# Patient Record
Sex: Male | Born: 1955 | Race: Black or African American | Hispanic: No | Marital: Single | Smoking: Never smoker
Health system: Southern US, Community
[De-identification: ages and names within clinical notes are randomized; demographics above are authoritative.]

## PROBLEM LIST (undated history)

## (undated) DIAGNOSIS — I10 Essential (primary) hypertension: Secondary | ICD-10-CM

---

## 2017-07-11 DIAGNOSIS — R7302 Impaired glucose tolerance (oral): Secondary | ICD-10-CM | POA: Insufficient documentation

## 2017-07-25 DIAGNOSIS — Z13220 Encounter for screening for lipoid disorders: Secondary | ICD-10-CM | POA: Diagnosis not present

## 2017-07-25 DIAGNOSIS — Z125 Encounter for screening for malignant neoplasm of prostate: Secondary | ICD-10-CM | POA: Diagnosis not present

## 2017-07-25 DIAGNOSIS — Z23 Encounter for immunization: Secondary | ICD-10-CM | POA: Diagnosis not present

## 2017-07-25 DIAGNOSIS — Z1159 Encounter for screening for other viral diseases: Secondary | ICD-10-CM | POA: Diagnosis not present

## 2017-07-25 DIAGNOSIS — Z131 Encounter for screening for diabetes mellitus: Secondary | ICD-10-CM | POA: Diagnosis not present

## 2017-07-25 DIAGNOSIS — Z0000 Encounter for general adult medical examination without abnormal findings: Secondary | ICD-10-CM | POA: Diagnosis not present

## 2017-08-13 DIAGNOSIS — Z1211 Encounter for screening for malignant neoplasm of colon: Secondary | ICD-10-CM | POA: Diagnosis not present

## 2017-09-22 DIAGNOSIS — Z1211 Encounter for screening for malignant neoplasm of colon: Secondary | ICD-10-CM | POA: Diagnosis not present

## 2017-09-22 DIAGNOSIS — K5730 Diverticulosis of large intestine without perforation or abscess without bleeding: Secondary | ICD-10-CM | POA: Diagnosis not present

## 2020-02-11 ENCOUNTER — Ambulatory Visit (INDEPENDENT_AMBULATORY_CARE_PROVIDER_SITE_OTHER): Payer: Commercial Managed Care - PPO

## 2020-02-11 ENCOUNTER — Ambulatory Visit
Admission: EM | Admit: 2020-02-11 | Discharge: 2020-02-11 | Disposition: A | Discharge status: Home / Self Care | Payer: Commercial Managed Care - PPO | Attending: Emergency Medicine | Admitting: Emergency Medicine

## 2020-02-11 DIAGNOSIS — R1011 Right upper quadrant pain: Secondary | ICD-10-CM | POA: Diagnosis not present

## 2020-02-11 DIAGNOSIS — R0781 Pleurodynia: Secondary | ICD-10-CM | POA: Diagnosis not present

## 2020-02-11 DIAGNOSIS — S29011A Strain of muscle and tendon of front wall of thorax, initial encounter: Secondary | ICD-10-CM

## 2020-02-11 MED ORDER — TIZANIDINE HCL 4 MG PO TABS: 4.0000 mg | ORAL_TABLET | Freq: Three times a day (TID) | ORAL | 0 refills | Status: DC | PRN

## 2020-02-11 NOTE — ED Provider Notes (Signed)
HPI  SUBJECTIVE:  Brian Ingram is a 64 y.o. male who presents with 2 days of sharp, seconds long nonmigratory, nonradiating anterior right lower rib pain.  He has 1-3 episodes per day.  The last 1 to 2 seconds and then resolved.  No fevers, nausea, vomiting, cough, wheeze, shortness of breath, pleuritic pain with inspiration.  No abdominal distention, no other abdominal pain.  No surgery in the past 4 weeks, recent immobilization, hemoptysis, calf pain or swelling.  No rash in the area of pain.  No trauma.  No change in his physical activity.  He denies other chest pain.  He is able to point with 1 finger to the area of pain.  He has never had symptoms like this before.  He has not tried anything for this.  It resolves on its own.  It is not associated with eating, torso rotation, movement, inspiration.  Past medical history negative for diabetes, DVT, PE, cancer, hypertension, gallbladder disease, liver disease, heavy alcohol use.  He had varicella as a child.  PMD: Dr. Bayard Males   History reviewed. No pertinent past medical history.  History reviewed. No pertinent surgical history.  Family History  Problem Relation Age of Onset  . Healthy Mother   . Cancer Father     Social History   Tobacco Use  . Smoking status: Never Smoker  . Smokeless tobacco: Never Used  Vaping Use  . Vaping Use: Never used  Substance Use Topics  . Alcohol use: Yes  . Drug use: Never    No current facility-administered medications for this encounter.  Current Outpatient Medications:  .  tiZANidine (ZANAFLEX) 4 MG tablet, Take 1 tablet (4 mg total) by mouth every 8 (eight) hours as needed for muscle spasms., Disp: 30 tablet, Rfl: 0  No Known Allergies   ROS  As noted in HPI.   Physical Exam  BP (!) 144/90 (BP Location: Left Arm)   Pulse 95   Temp 99.2 F (37.3 C)   Resp 18   SpO2 96%   Constitutional: Well developed, well nourished, no acute distress Eyes:  EOMI, conjunctiva normal  bilaterally HENT: Normocephalic, atraumatic,mucus membranes moist Respiratory: Normal inspiratory effort, lungs clear bilaterally, good air movement.  No tenderness, bruising, rash in the area of pain.  Patient points to ribs 7/8 at the costochondral junction as area of pain.  Pain is not aggravated with deep inspiration,, torso movement Cardiovascular: Normal rate, regular rhythm no murmurs rubs or gallops GI: Normal appearance, soft, nontender, nondistended.  No guarding, rebound.  Active bowel sounds.  No palpable liver.  Negative Murphy. skin: No rash, skin intact Musculoskeletal: Calves symmetric, nontender, no edema Neurologic: Alert & oriented x 3, no focal neuro deficits Psychiatric: Speech and behavior appropriate   ED Course   Medications - No data to display  Orders Placed This Encounter  Procedures  . DG Chest 2 View    Standing Status:   Standing    Number of Occurrences:   1    Order Specific Question:   Reason for Exam (SYMPTOM  OR DIAGNOSIS REQUIRED)    Answer:   R anteiror rib pain r/o effusion ptx    No results found for this or any previous visit (from the past 24 hour(s)). DG Chest 2 View  Result Date: 02/11/2020 CLINICAL DATA:  64 year old male with right anterior rib and right upper quadrant pain for 3-4 days. Query effusion or pneumothorax. EXAM: CHEST - 2 VIEW COMPARISON:  None. FINDINGS: Normal lung volumes  and mediastinal contours. Visualized tracheal air column is within normal limits. No pneumothorax or pleural effusion. Bilateral lung markings are within normal limits. No pulmonary edema or confluent pulmonary opacity. Unremarkable visible osseous structures. Negative visible bowel gas pattern. IMPRESSION: Negative.  No acute cardiopulmonary abnormality. Electronically Signed   By: Odessa Fleming M.D.   On: 02/11/2020 15:15    ED Clinical Impression  1. Intercostal muscle strain, initial encounter      ED Assessment/Plan  Patient points to ribs 7 and 8 at  the costochondral junction as areas of pain.  In the differential is costochondritis, intercostal muscle spasm.  However pain is not reproducible which would be more consistent with costochondritis.  Favor muscle spasm.  His abdomen is benign.  Do not think that cholecystitis, cholelithiasis, hepatitis, PE likely.  Will check chest x-ray to rule out pneumonia, pneumothorax, pleural effusion.  There is no evidence of trauma to the ribs thus rib series was not done.  Reviewed imaging independently. Normal CXR.  See radiology report for full details.  Chest x-ray normal.  Suspect intermittent intercostal muscle spasm.  Will send home with Zanaflex.  Follow-up with PMD as needed.  Strict ER return precautions given.    Discussed  imaging, MDM, treatment plan, and plan for follow-up with patient. Discussed sn/sx that should prompt return to the ED. patient agrees with plan.   Meds ordered this encounter  Medications  . tiZANidine (ZANAFLEX) 4 MG tablet    Sig: Take 1 tablet (4 mg total) by mouth every 8 (eight) hours as needed for muscle spasms.    Dispense:  30 tablet    Refill:  0    *This clinic note was created using Scientist, clinical (histocompatibility and immunogenetics). Therefore, there may be occasional mistakes despite careful proofreading.   ?    Domenick Gong, MD 02/11/20 [phone]

## 2020-02-11 NOTE — ED Triage Notes (Signed)
RUQ pain x 3-4 days.  Lasts about 4-5 seconds and occurs 2-3 times per day.  No accompanying symptoms.  Denies n/v. No urinary concerns.

## 2020-02-11 NOTE — Discharge Instructions (Addendum)
Your chest x-ray was negative for fluid, infection, or collapsed lung.  I suspect this is a muscle strain with resulting spasm.  You can take the Zanaflex if the pain becomes prolonged.  Go immediately to the ER if you get worse, changes, shortness of breath, coughing up blood, fevers above 100.4, or for other concerns.

## 2021-01-05 IMAGING — CR DG CHEST 2V
3 series · 3 of 3 positions shown · non-contrast
Comparison: None.

CLINICAL DATA: 63-year-old male with right anterior rib and right
upper quadrant pain for 3-4 days. Query effusion or pneumothorax.

EXAM:
CHEST - 2 VIEW

[chest pa]
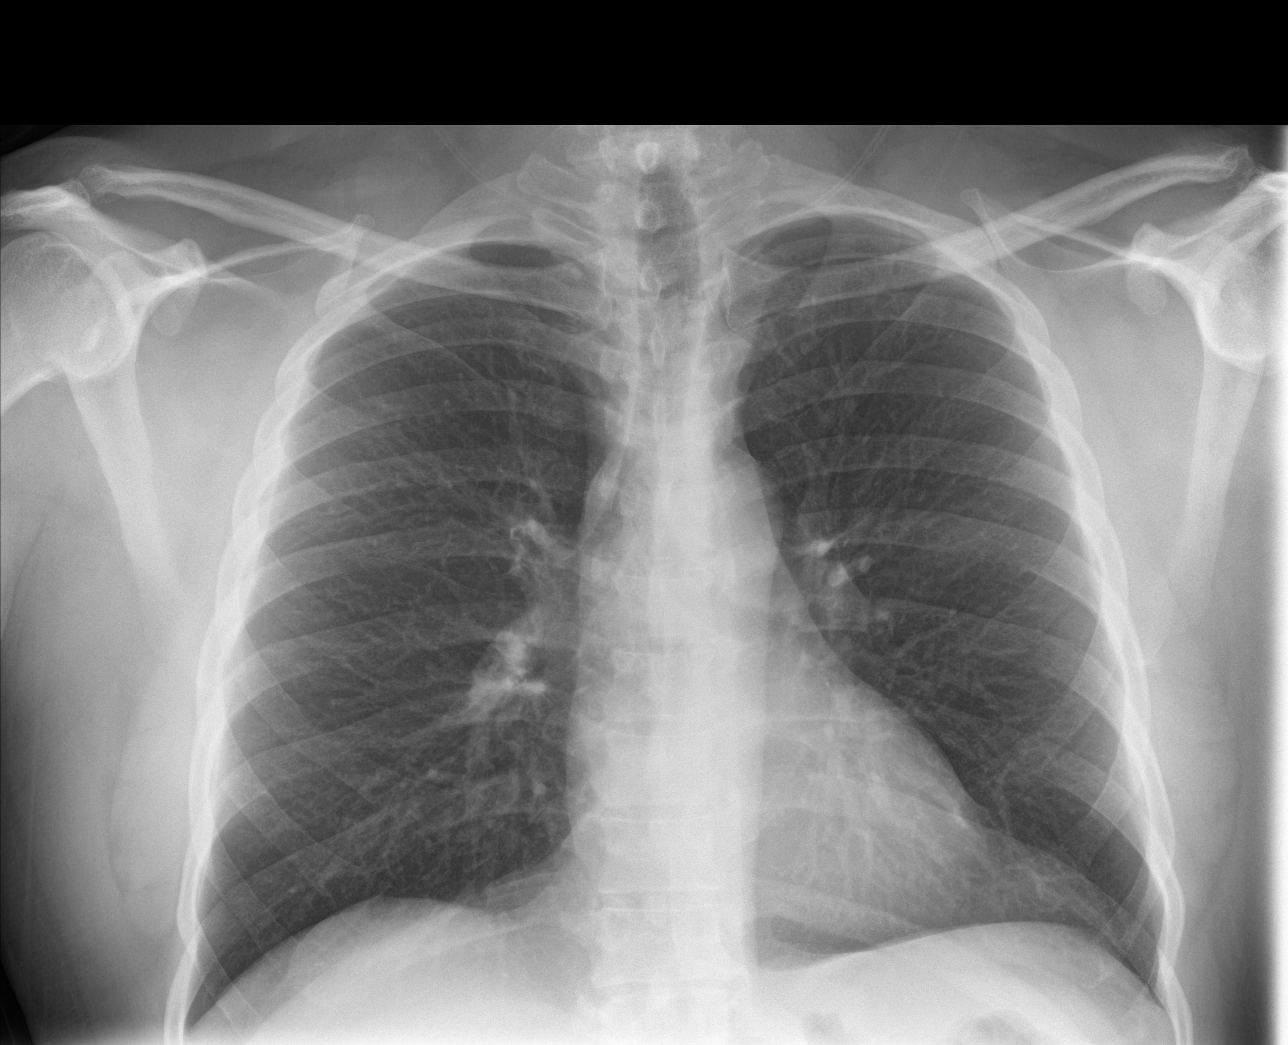

[chest lat (1 of 2)]
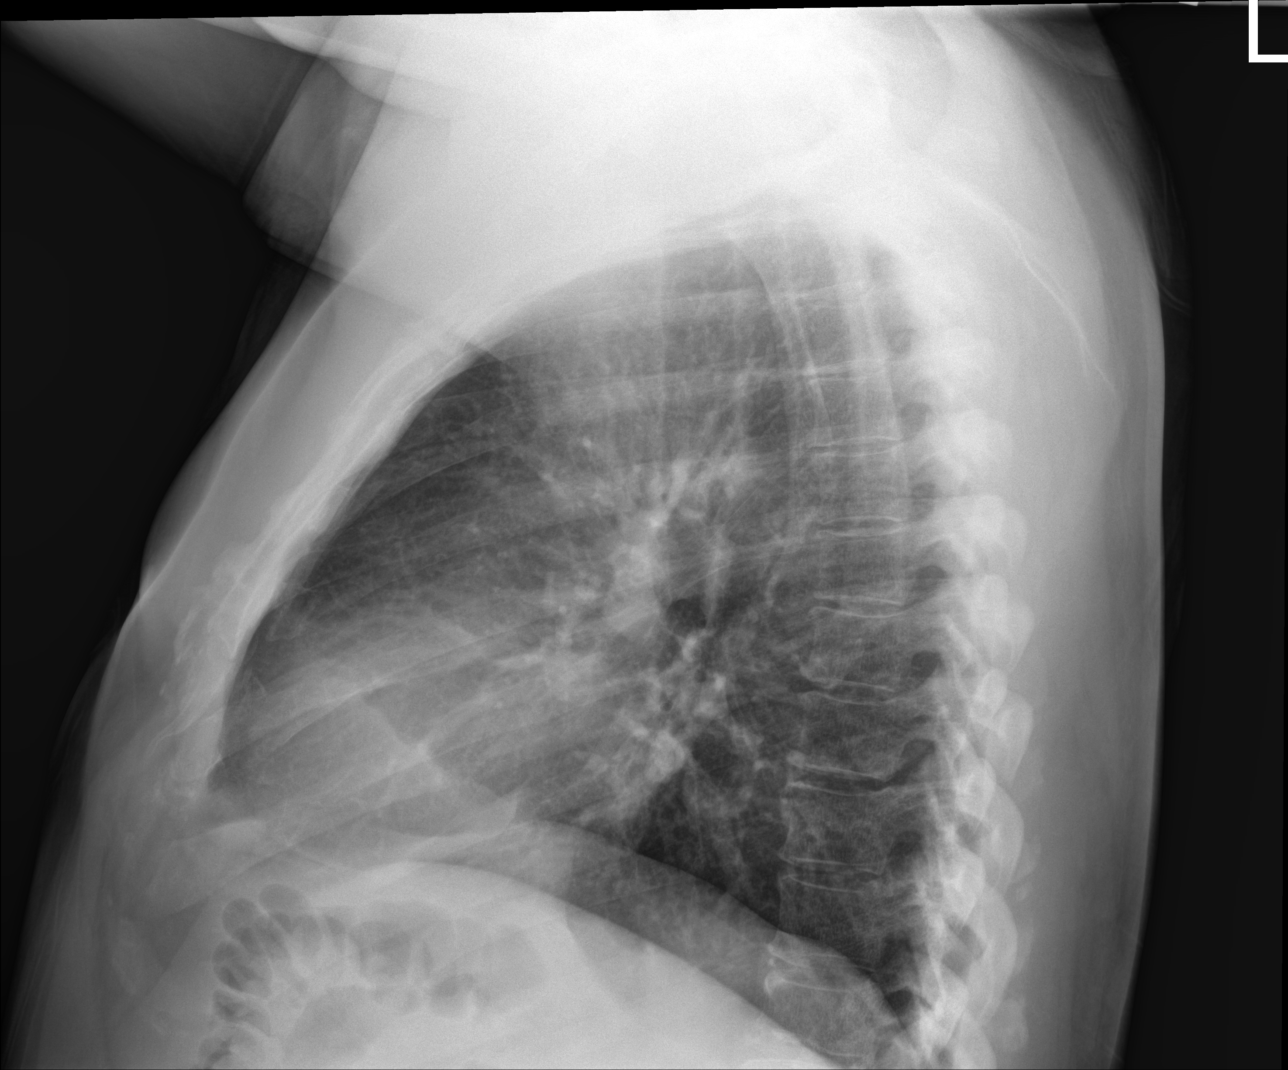

[chest lat (2 of 2)]
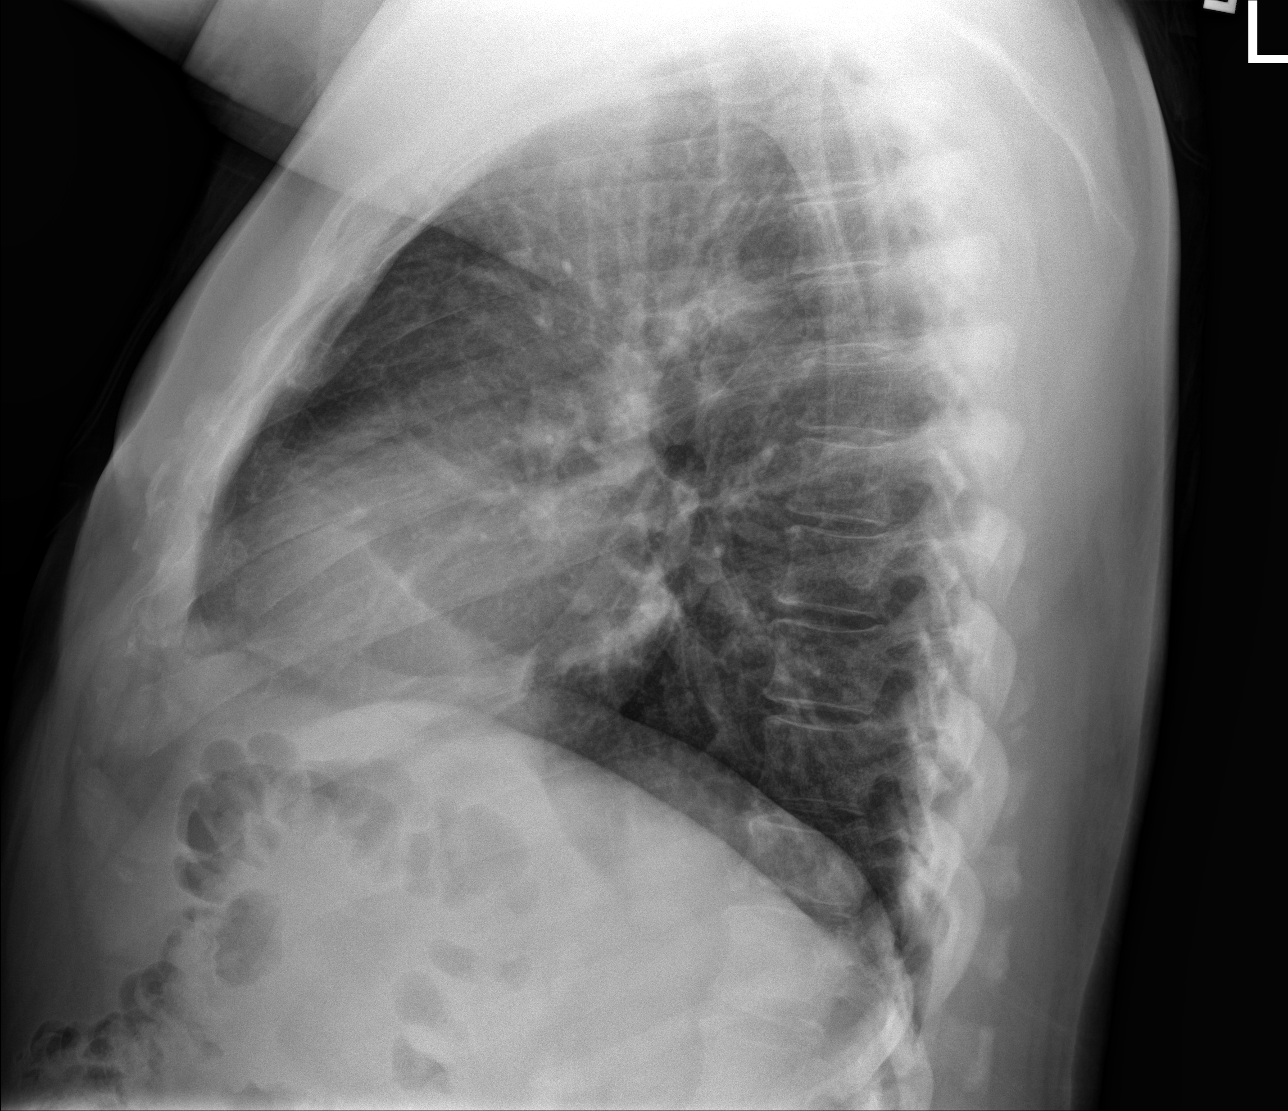

[3 of 3 positions shown; findings below may reference images not displayed]

FINDINGS: Normal lung volumes and mediastinal contours. Visualized tracheal
air column is within normal limits. No pneumothorax or pleural
effusion. Bilateral lung markings are within normal limits. No
pulmonary edema or confluent pulmonary opacity.

Unremarkable visible osseous structures. Negative visible bowel gas
pattern.
IMPRESSION: Negative.  No acute cardiopulmonary abnormality.

## 2021-03-23 DIAGNOSIS — I10 Essential (primary) hypertension: Secondary | ICD-10-CM | POA: Insufficient documentation

## 2021-08-03 ENCOUNTER — Ambulatory Visit
Admission: EM | Admit: 2021-08-03 | Discharge: 2021-08-03 | Disposition: A | Discharge status: Home / Self Care | Payer: Commercial Managed Care - PPO | Attending: Internal Medicine | Admitting: Internal Medicine

## 2021-08-03 ENCOUNTER — Other Ambulatory Visit: Payer: Self-pay

## 2021-08-03 DIAGNOSIS — M79672 Pain in left foot: Secondary | ICD-10-CM | POA: Diagnosis not present

## 2021-08-03 DIAGNOSIS — E669 Obesity, unspecified: Secondary | ICD-10-CM | POA: Insufficient documentation

## 2021-08-03 NOTE — ED Provider Notes (Signed)
?[facility] ? ? ? ?CSN: [id number] ?Arrival date & time: 08/03/21  1324 ? ? ?  ? ?History   ?Chief Complaint ?Chief Complaint  ?Patient presents with  ? Foot Pain  ?  Left  ? ? ?HPI ?Brian Ingram is a 66 y.o. male comes to the urgent care with several days history of left foot pain.  Patient says pain got worse over the past few days.  Pain is sharp, in the arch of his left foot, aggravated by ambulation and relieved after he was just pain ointment into the area.  Patient denies any trauma to the left foot.  He denies any episodes in the past.  Pain does not radiate to the toes.  No rash or skin changes noted.  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  His footwear is comfortable.  He is able to bear weight. ? ?HPI ? ?History reviewed. No pertinent past medical history. ? ?Patient Active Problem List  ? Diagnosis Date Noted  ? Obesity (BMI 30-39.9) 08/03/2021  ? Primary hypertension 03/23/2021  ? IGT (impaired glucose tolerance) 07/11/2017  ? ? ?History reviewed. No pertinent surgical history. ? ? ? ? ?Home Medications   ? ?Prior to Admission medications   ?Medication Sig Start Date End Date Taking? Authorizing Provider  ?losartan (COZAAR) 25 MG tablet Take by mouth. 03/23/21  Yes [provider]  ?tiZANidine (ZANAFLEX) 4 MG tablet Take 1 tablet (4 mg total) by mouth every 8 (eight) hours as needed for muscle spasms. 02/11/20  Yes Domenick Gong, MD  ? ? ?Family History ?Family History  ?Problem Relation Age of Onset  ? Healthy Mother   ? Cancer Father   ? ? ?Social History ?Social History  ? ?Tobacco Use  ? Smoking status: Never  ? Smokeless tobacco: Never  ?Vaping Use  ? Vaping Use: Never used  ?Substance Use Topics  ? Alcohol use: Yes  ?  Comment: Occ.  ? Drug use: Never  ? ? ? ?Allergies   ?Patient has no known allergies. ? ? ?Review of Systems ?Review of Systems  ?Gastrointestinal: Negative.   ?Musculoskeletal: Negative.  Negative for gait problem and joint  swelling.  ?Skin: Negative.   ? ? ?Physical Exam ?Triage Vital Signs ?ED Triage Vitals  ?Enc Vitals Group  ?   BP 08/03/21 0823 (!) 145/91  ?   Pulse Rate 08/03/21 0823 82  ?   Resp 08/03/21 0823 18  ?   Temp 08/03/21 0823 98.9 ?F (37.2 ?C)  ?   Temp Source 08/03/21 0823 Oral  ?   SpO2 08/03/21 0823 98 %  ?   Weight 08/03/21 0821 198 lb (89.8 kg)  ?   Height 08/03/21 0821 5' 4.5" (1.638 m)  ?   Head Circumference --   ?   Peak Flow --   ?   Pain Score 08/03/21 0819 4  ?   Pain Loc --   ?   Pain Edu? --   ?   Excl. in GC? --   ? ?No data found. ? ?Updated Vital Signs ?BP (!) 145/91 (BP Location: Left Arm)   Pulse 82   Temp 98.9 ?F (37.2 ?C) (Oral)   Resp 18   Ht 5' 4.5" (1.638 m)   Wt 89.8 kg   SpO2 98%   BMI 33.46 kg/m?  ? ?Visual Acuity ?Right Eye Distance:   ?Left Eye Distance:   ?Bilateral Distance:   ? ?Right Eye Near:   ?  Left Eye Near:    ?Bilateral Near:    ? ?Physical Exam ?Vitals and nursing note reviewed.  ?Constitutional:   ?   General: He is not in acute distress. ?   Appearance: He is not ill-appearing.  ?Cardiovascular:  ?   Rate and Rhythm: Normal rate and regular rhythm.  ?Musculoskeletal:     ?   General: Normal range of motion.  ?   Comments: Tenderness on palpation of the medial aspect of the arch of the left foot.  No bruising.  ?Neurological:  ?   Mental Status: He is alert.  ? ? ? ?UC Treatments / Results  ?Labs ?(all labs ordered are listed, but only abnormal results are displayed) ?Labs Reviewed - No data to display ? ?EKG ? ? ?Radiology ?No results found. ? ?Procedures ?Procedures (including critical care time) ? ?Medications Ordered in UC ?Medications - No data to display ? ?Initial Impression / Assessment and Plan / UC Course  ?I have reviewed the triage vital signs and the nursing notes. ? ?Pertinent labs & imaging results that were available during my care of the patient were reviewed by me and considered in my medical decision making (see chart for details). ? ?  ? ?1.  Left foot  pain: ?This is likely sprain of the midfoot ?Appropriate sole inserts recommended ?Continue anti-inflammatory ointment ?Gentle stretching exercises ?Rest and elevation of the left foot after work ?No indication for x-rays. ?Return precautions given. ?Final Clinical Impressions(s) / UC Diagnoses  ? ?Final diagnoses:  ?Foot arch pain, left  ? ? ? ?Discharge Instructions   ? ?  ?Rest, icing and gentle range of motion exercises will help with pain ?Appropriate shoe inserts will help with pain ?Gentle stretching exercises ?Continue using the ointment you have at home since its helping with the pain ?Return to urgent care if you have worsening symptoms. ? ? ? ?ED Prescriptions   ?None ?  ? ?PDMP not reviewed this encounter. ?  ?Merrilee Jansky, MD ?08/03/21 1603 ? ?

## 2021-08-03 NOTE — Discharge Instructions (Addendum)
Rest, icing and gentle range of motion exercises will help with pain ?Appropriate shoe inserts will help with pain ?Gentle stretching exercises ?Continue using the ointment you have at home since its helping with the pain ?Return to urgent care if you have worsening symptoms. ? ?

## 2021-08-03 NOTE — ED Triage Notes (Signed)
Patient is here for "left foot pain, bottom of left foot, with radiation to heel". "Hurts more with pressure". No injury known.  ?

## 2022-04-29 DIAGNOSIS — E785 Hyperlipidemia, unspecified: Secondary | ICD-10-CM | POA: Insufficient documentation

## 2022-11-29 ENCOUNTER — Encounter: Payer: Self-pay | Admitting: Urology

## 2022-11-29 ENCOUNTER — Ambulatory Visit (INDEPENDENT_AMBULATORY_CARE_PROVIDER_SITE_OTHER): Payer: BLUE CROSS/BLUE SHIELD | Admitting: Urology

## 2022-11-29 VITALS — BP 152/78 | HR 90 | Ht 65.0 in | Wt 198.0 lb

## 2022-11-29 DIAGNOSIS — R972 Elevated prostate specific antigen [PSA]: Secondary | ICD-10-CM

## 2022-11-29 NOTE — Progress Notes (Signed)
I, Brian Ingram,acting as a scribe for Brian Altes, MD.,have documented all relevant documentation on the behalf of Brian Altes, MD,as directed by  Brian Altes, MD while in the presence of Brian Altes, MD.  11/29/2022 11:48 AM   Gar Brian Ingram [birth year] [id number]  Referring provider: Myrene Buddy, NP [address] [place],  [place] [address]  Chief Complaint  Patient presents with   Elevated PSA    HPI: Brian Ingram is a 67 y.o. male here for evaluation of an elevated PSA.   PSA 04/29/22 elevated 7.86; repeat PSA 10/29/22 persistently elevated at 6.63  Prior baseline PSA in the mid to upper 3 range. No bothersome lower urinary tract symptoms.  Father with a history of prostate cancer. He is from [place] and the state's men from his country do not undergo prophylactic screening and his prostate cancer was diagnosed in late stages.     Home Medications:  Allergies as of 11/29/2022   No Known Allergies      Medication List        Accurate as of November 29, 2022 11:48 AM. If you have any questions, ask your nurse or doctor.          losartan 25 MG tablet Commonly known as: COZAAR Take by mouth.   tiZANidine 4 MG tablet Commonly known as: ZANAFLEX Take 1 tablet (4 mg total) by mouth every 8 (eight) hours as needed for muscle spasms.        Allergies: No Known Allergies  Family History: Family History  Problem Relation Age of Onset   Healthy Mother    Cancer Father     Social History:  reports that he has never smoked. He has never used smokeless tobacco. He reports current alcohol use. He reports that he does not use drugs.   Physical Exam: BP (!) 152/78   Pulse 90   Ht 5\' 5"  (1.651 m)   Wt 198 lb (89.8 kg)   BMI 32.95 kg/m   Constitutional:  Alert and oriented, No acute distress. HEENT: [place] AT, moist mucus membranes.  Trachea midline, no masses. Cardiovascular: No clubbing, cyanosis, or edema. Respiratory: Normal  respiratory effort, no increased work of breathing. GI: Abdomen is soft, nontender, nondistended, no abdominal masses GU: Prostate 40 grams smooth without nodules.  Skin: No rashes, bruises or suspicious lesions. Neurologic: Grossly intact, no focal deficits, moving all 4 extremities. Psychiatric: Normal mood and affect.   Assessment & Plan:    1. Elevated PSA Benign DRE Although PSA is a prostate cancer screening test he was informed that cancer is not the most common cause of an elevated PSA. Other potential causes including BPH and inflammation were discussed. He was informed that the only way to adequately diagnose prostate cancer would be a transrectal ultrasound and biopsy of the prostate. The procedure was discussed including potential risks of bleeding and infection/sepsis. He was also informed that a negative biopsy does not conclusively rule out the possibility that prostate cancer may be present and that continued monitoring is required. The use of newer adjunctive blood tests including PHI and 4kScore were discussed. The use of multiparametric prostate MRI was also discussed however is not typically used for initial evaluation of an elevated PSA. Continued periodic surveillance was also discussed.  He has elected to schedule prostate MRI. Order was placed and we'll call with results and recommendations based on MRI findings.  [facility] [address]  [place], [place] [address] [phone]

## 2023-01-05 ENCOUNTER — Ambulatory Visit
Admission: RE | Admit: 2023-01-05 | Discharge: 2023-01-05 | Disposition: A | Discharge status: Home / Self Care | Payer: BLUE CROSS/BLUE SHIELD | Source: Ambulatory Visit | Attending: Urology | Admitting: Urology

## 2023-01-05 DIAGNOSIS — R972 Elevated prostate specific antigen [PSA]: Secondary | ICD-10-CM | POA: Insufficient documentation

## 2023-01-05 MED ORDER — GADOBUTROL 1 MMOL/ML IV SOLN
9.0000 mL | Freq: Once | INTRAVENOUS | Status: AC | PRN
  Administered 2023-01-05: 9 mL via INTRAVENOUS

## 2023-01-23 ENCOUNTER — Ambulatory Visit: Payer: BLUE CROSS/BLUE SHIELD | Admitting: Urology

## 2023-01-23 VITALS — BP 149/89 | HR 80

## 2023-01-23 DIAGNOSIS — R972 Elevated prostate specific antigen [PSA]: Secondary | ICD-10-CM

## 2023-01-23 DIAGNOSIS — C61 Malignant neoplasm of prostate: Secondary | ICD-10-CM | POA: Diagnosis not present

## 2023-01-23 DIAGNOSIS — Z2989 Encounter for other specified prophylactic measures: Secondary | ICD-10-CM

## 2023-01-23 DIAGNOSIS — N4231 Prostatic intraepithelial neoplasia: Secondary | ICD-10-CM

## 2023-01-23 MED ORDER — GENTAMICIN SULFATE 40 MG/ML IJ SOLN
80.0000 mg | Freq: Once | INTRAMUSCULAR | Status: AC
  Administered 2023-01-23: 80 mg via INTRAMUSCULAR

## 2023-01-23 MED ORDER — LEVOFLOXACIN 500 MG PO TABS
500.0000 mg | ORAL_TABLET | Freq: Once | ORAL | Status: AC
  Administered 2023-01-23: 500 mg via ORAL

## 2023-01-23 NOTE — Patient Instructions (Signed)

## 2023-01-23 NOTE — Progress Notes (Signed)
   01/23/23  Indication: 67 yo M with elevated PSA to 7.86, PIRAD 4 x 2 on MRI  MRI Fusion Prostate Biopsy Procedure   Informed consent was obtained, and we discussed the risks of bleeding and infection/sepsis. A time out was performed to ensure correct patient identity.  Pre-Procedure: - Last PSA Level: 7.86 - Gentamicin and levaquin given for antibiotic prophylaxis -Prostate measured 61.38 g on MRI, PSA density 0.12 - Small intravesical median lobe  Procedure: - Prostate block performed using 10 cc 1% lidocaine  - MRI fusion biopsy was performed, and 3 biopsies were taken from the ROI #1  PI-RADS category 4 lesion of the right anterior transition zone and right anterior peripheral zone along with 3 biopsies from the ROI #2  PI-RADS category 4 lesion of the right posteromedial peripheral zone in the mid gland base - Standard biopsies taken from sextant areas, 12 under ultrasound guidance. - Total of 18 cores taken  Post-Procedure: - Patient tolerated the procedure well - He was counseled to seek immediate medical attention if experiences significant bleeding, fevers, or severe pain - Return in one week to discuss biopsy results  Assessment/ Plan: Will follow up in 1-2 weeks to discuss pathology with Dr. Nash Mantis, MD

## 2023-01-31 ENCOUNTER — Telehealth: Payer: Self-pay | Admitting: Urology

## 2023-01-31 NOTE — Telephone Encounter (Signed)
I contacted Brian Ingram to discuss his prostate biopsy report.  No postbiopsy complaints.  3 biopsies each ROI + standard 12 core template biopsies were performed  Pathology: PI-RADS 4 lesion showed Gleason 3+3 adenocarcinoma involving 6% of the submitted tissue; PI-RADS 3 lesion with benign prostate tissue.  3/6 left template cores positive for Gleason 3+3 adenocarcinoma involving 12-22% of submitted tissue; 4/6 right template cores with Gleason 3+4 adenocarcinoma RB and RM involving 11% and 25% respectively and Gleason 3+3 adenocarcinoma RLB and are LM involving 5 and 2% of submitted tissue respectively.  NCCN risk stratification: Favorable intermediate  Based on number of positive cores and intermediate risk disease would not recommend active surveillance.  We discussed standard treatment options of radical prostatectomy and radiation modalities.  Both procedures were discussed.  He will think over these options and would like to schedule a follow-up visit in the office to discuss further.

## 2023-02-07 ENCOUNTER — Encounter: Payer: Self-pay | Admitting: Urology

## 2023-02-07 ENCOUNTER — Ambulatory Visit: Payer: BLUE CROSS/BLUE SHIELD | Admitting: Urology

## 2023-02-07 VITALS — BP 118/72 | HR 99 | Ht 64.0 in | Wt 190.0 lb

## 2023-02-07 DIAGNOSIS — C61 Malignant neoplasm of prostate: Secondary | ICD-10-CM

## 2023-02-07 NOTE — Progress Notes (Signed)
I, Maysun Anabel Bene, acting as a scribe for Riki Altes, MD., have documented all relevant documentation on the behalf of Riki Altes, MD, as directed by Riki Altes, MD while in the presence of Riki Altes, MD.  02/07/2023 12:36 PM   Brian Ingram 11-May-1956 [id number]  Referring provider: No referring provider defined for this encounter.  Chief Complaint  Patient presents with   Results   Urologic history: 1. Elevated PSA PSA 04/29/22 elevated 7.86; repeat PSA 10/29/22 persistently elevated at 6.63  Prior baseline PSA in the mid to upper 3 range.  HPI: Brian Ingram is a 67 y.o. male presents for prostate biopsy follow-up. He presents today with his daughter.  Fusion biopsy performed 01/23/2023. I contacted Mr. Brian Ingram with the results-refer to my telephone encounter of 01/31/2023. Since his biopsy, he states he has no appetite. No nausea, vomitting, or abdominal pain, and states he does just not feel like eating. 3 biopsies each ROI + standard 12 core template biopsies were performed Pathology: PI-RADS 4 lesion showed Gleason 3+3 adenocarcinoma involving 6% of the submitted tissue; PI-RADS 3 lesion with benign prostate tissue.  3/6 left template cores positive for Gleason 3+3 adenocarcinoma involving 12-22% of submitted tissue; 4/6 right template cores with Gleason 3+4 adenocarcinoma RB and RM involving 11% and 25% respectively and Gleason 3+3 adenocarcinoma RLB and are LM involving 5 and 2% of submitted tissue respectively. NCCN risk stratification: Favorable intermediate    Home Medications:  Allergies as of 02/07/2023   No Known Allergies      Medication List        Accurate as of February 07, 2023 12:36 PM. If you have any questions, ask your nurse or doctor.          losartan 25 MG tablet Commonly known as: COZAAR Take by mouth.   tiZANidine 4 MG tablet Commonly known as: ZANAFLEX Take 1 tablet (4 mg total) by mouth every 8 (eight) hours  as needed for muscle spasms.        Allergies: No Known Allergies  Family History: Family History  Problem Relation Age of Onset   Healthy Mother    Cancer Father     Social History:  reports that he has never smoked. He has never used smokeless tobacco. He reports current alcohol use. He reports that he does not use drugs.   Physical Exam: BP 118/72   Pulse 99   Ht 5\' 4"  (1.626 m)   Wt 190 lb (86.2 kg)   BMI 32.61 kg/m   Constitutional:  Alert, No acute distress. HEENT: [place] AT Respiratory: Normal respiratory effort, no increased work of breathing. Psychiatric: Normal mood and affect.   Assessment & Plan:    1. Favorable intermediate risk prostate cancer Again recommended treatment due to a high volume of disease and Gleason 3+4 adenocarcinoma. We again discussed radical prostatectomy and radiation modalities.  All questions were answered and he will think over these options and call back with a decision. We discuss his loss of appetite is unlikely secondary to prostate biopsy. He will contact his PCP for further evaluation. We discuss the possibility that this could be psychological with worrying about the pathology results and treatment.   I have reviewed the above documentation for accuracy and completeness, and I agree with the above.   Riki Altes, MD  [facility] [address] [place], [place] [address] [phone]

## 2023-02-17 ENCOUNTER — Ambulatory Visit
Admission: EM | Admit: 2023-02-17 | Discharge: 2023-02-17 | Disposition: A | Discharge status: Home / Self Care | Payer: BLUE CROSS/BLUE SHIELD | Attending: Emergency Medicine | Admitting: Emergency Medicine

## 2023-02-17 ENCOUNTER — Other Ambulatory Visit: Payer: Self-pay

## 2023-02-17 DIAGNOSIS — M436 Torticollis: Secondary | ICD-10-CM

## 2023-02-17 HISTORY — DX: Essential (primary) hypertension: I10

## 2023-02-17 MED ORDER — TIZANIDINE HCL 4 MG PO TABS: 4.0000 mg | ORAL_TABLET | Freq: Three times a day (TID) | ORAL | 0 refills | Status: AC | PRN
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
## 2023-02-17 NOTE — ED Provider Notes (Signed)
[facility]    CSN: [id number] Arrival date & time: 02/17/23  1010      History   Chief Complaint Chief Complaint  Patient presents with   Neck Injury    HPI Brian Ingram is a 67 y.o. male.   67 year old male patient, Brian Ingram, presents to urgent care for evaluation of neck pain since Thursday.  Patient denies any recent trauma or injury states he may have slept wrong, hurts to turn neck from side-to-side.  Took over-the-counter meds without relief.  The history is provided by the patient. No language interpreter was used.    Past Medical History:  Diagnosis Date   Hypertension     Patient Active Problem List   Diagnosis Date Noted   Torticollis, acute 02/17/2023   Obesity (BMI 30-39.9) 08/03/2021   Primary hypertension 03/23/2021   IGT (impaired glucose tolerance) 07/11/2017    History reviewed. No pertinent surgical history.     Home Medications    Prior to Admission medications   Medication Sig Start Date End Date Taking? Authorizing Provider  losartan (COZAAR) 25 MG tablet Take by mouth. 03/23/21  Yes [provider]  tiZANidine (ZANAFLEX) 4 MG tablet Take 1 tablet (4 mg total) by mouth every 8 (eight) hours as needed for up to 5 days for muscle spasms. 02/17/23 02/22/23  Dearius Hoffmann, Para March, NP    Family History Family History  Problem Relation Age of Onset   Healthy Mother    Cancer Father     Social History Social History   Tobacco Use   Smoking status: Never   Smokeless tobacco: Never  Vaping Use   Vaping status: Never Used  Substance Use Topics   Alcohol use: Yes    Comment: Occ.   Drug use: Never     Allergies   Patient has no known allergies.   Review of Systems Review of Systems  Musculoskeletal:  Positive for neck pain and neck stiffness.  All other systems reviewed and are negative.    Physical Exam Triage Vital Signs ED Triage Vitals  Encounter Vitals Group     BP 02/17/23 1042 121/72      Systolic BP Percentile --      Diastolic BP Percentile --      Pulse Rate 02/17/23 1042 100     Resp 02/17/23 1042 20     Temp 02/17/23 1042 98.5 F (36.9 C)     Temp src --      SpO2 02/17/23 1042 100 %     Weight --      Height --      Head Circumference --      Peak Flow --      Pain Score 02/17/23 1040 6     Pain Loc --      Pain Education --      Exclude from Growth Chart --    No data found.  Updated Vital Signs BP 121/72   Pulse 100   Temp 98.5 F (36.9 C)   Resp 20   SpO2 100%   Visual Acuity Right Eye Distance:   Left Eye Distance:   Bilateral Distance:    Right Eye Near:   Left Eye Near:    Bilateral Near:     Physical Exam Vitals and nursing note reviewed.  Constitutional:      General: He is not in acute distress.    Appearance: He is well-developed and well-groomed.  HENT:     Head: Normocephalic and  atraumatic.  Eyes:     Conjunctiva/sclera: Conjunctivae normal.  Neck:      Comments: Area of tenderness Cardiovascular:     Rate and Rhythm: Normal rate and regular rhythm.     Pulses:          Radial pulses are 2+ on the right side and 2+ on the left side.     Heart sounds: No murmur heard. Pulmonary:     Effort: Pulmonary effort is normal. No respiratory distress.     Breath sounds: Normal breath sounds.  Abdominal:     Palpations: Abdomen is soft.     Tenderness: There is no abdominal tenderness.  Musculoskeletal:        General: No swelling.     Cervical back: Neck supple. Torticollis present. Pain with movement and muscular tenderness present. No spinous process tenderness.  Skin:    General: Skin is warm and dry.     Capillary Refill: Capillary refill takes less than 2 seconds.     Findings: No rash.  Neurological:     General: No focal deficit present.     Mental Status: He is alert and oriented to person, place, and time.     GCS: GCS eye subscore is 4. GCS verbal subscore is 5. GCS motor subscore is 6.     Cranial Nerves: No  cranial nerve deficit.     Sensory: No sensory deficit.     Comments: Handgrips equal bilaterally, 5/5  Psychiatric:        Attention and Perception: Attention normal.        Mood and Affect: Mood normal.        Speech: Speech normal.        Behavior: Behavior normal. Behavior is cooperative.      UC Treatments / Results  Labs (all labs ordered are listed, but only abnormal results are displayed) Labs Reviewed - No data to display  EKG   Radiology No results found.  Procedures Procedures (including critical care time)  Medications Ordered in UC Medications - No data to display  Initial Impression / Assessment and Plan / UC Course  I have reviewed the triage vital signs and the nursing notes.  Pertinent labs & imaging results that were available during my care of the patient were reviewed by me and considered in my medical decision making (see chart for details).     Ddx: Torticollis, neck pain,spasm Final Clinical Impressions(s) / UC Diagnoses   Final diagnoses:  Torticollis, acute     Discharge Instructions      Take muscle relaxer(tizanidine) as directed, drowsiness precautions.   Use over-the-counter Biofreeze lidocaine patches as label directed for discomfort  May take Tylenol as label directed for pain  Follow-up with PCP, Dr. Lorin Picket, if pain persists.  Go immediately to the emergency room if you develop loss of function, worsening symptoms, etc.     ED Prescriptions     Medication Sig Dispense Auth. Provider   tiZANidine (ZANAFLEX) 4 MG tablet Take 1 tablet (4 mg total) by mouth every 8 (eight) hours as needed for up to 5 days for muscle spasms. 15 tablet Darik Massing, Para March, NP      PDMP not reviewed this encounter.   Clancy Gourd, NP 02/17/23 1450

## 2023-02-17 NOTE — Discharge Instructions (Addendum)
Take muscle relaxer(tizanidine) as directed, drowsiness precautions.   Use over-the-counter Biofreeze lidocaine patches as label directed for discomfort  May take Tylenol as label directed for pain  Follow-up with PCP, Dr. Lorin Picket, if pain persists.  Go immediately to the emergency room if you develop loss of function, worsening symptoms, etc.

## 2023-02-24 ENCOUNTER — Telehealth: Payer: Self-pay | Admitting: Urology

## 2023-02-24 DIAGNOSIS — C61 Malignant neoplasm of prostate: Secondary | ICD-10-CM

## 2023-02-24 NOTE — Telephone Encounter (Signed)
Talked withpatient and he state he wants radical prostatectomy

## 2023-02-24 NOTE — Telephone Encounter (Signed)
Patient called to let Dr. Lonna Cobb know that he is ready to start radiation. Please advise patient on next steps.

## 2023-03-03 NOTE — Telephone Encounter (Signed)
Called patient to advised about prostatectomy appt.  Now patient would like a referral  to cancer center for radiation oncology

## 2023-03-05 NOTE — Addendum Note (Signed)
Addended by: Riki Altes on: 03/05/2023 07:03 AM   Modules accepted: Orders

## 2023-03-17 ENCOUNTER — Encounter: Payer: Self-pay | Admitting: Radiation Oncology

## 2023-03-17 ENCOUNTER — Ambulatory Visit
Admission: RE | Admit: 2023-03-17 | Discharge: 2023-03-17 | Disposition: A | Discharge status: Home / Self Care | Payer: BLUE CROSS/BLUE SHIELD | Source: Ambulatory Visit | Attending: Radiation Oncology | Admitting: Radiation Oncology

## 2023-03-17 VITALS — BP 126/74 | HR 92 | Temp 98.3°F | Resp 16 | Ht 64.5 in | Wt 188.4 lb

## 2023-03-17 DIAGNOSIS — C61 Malignant neoplasm of prostate: Secondary | ICD-10-CM | POA: Diagnosis present

## 2023-03-17 NOTE — Consult Note (Signed)
NEW PATIENT EVALUATION  Name: Brian Ingram  MRN: [id number]  Date:   03/17/2023     DOB: 12/30/1955   This 67 y.o. 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  7 range  REFERRING PHYSICIAN: Stoioff, Verna Czech, MD  CHIEF COMPLAINT:  Chief Complaint  Patient presents with   Prostate Cancer    DIAGNOSIS: The encounter diagnosis was Prostate cancer (HCC).   PREVIOUS INVESTIGATIONS:  MRI scan of prostate reviewed Clinical notes reviewed Pathology report reviewed  HPI: Patient is a 67 year old male who presents with an elevated PSA over time 4 months ago was 6.6.  He had an MRI scan of his prostate showing 2 PI-RADS category 4 lesions in the right prostate gland with targeting data sent for UroNav.  He underwent 12 biopsies of his prostate 7 which were positive for mixture of Gleason 6 and Gleason 7 (3+4) adenocarcinoma.  Patient does have some nocturia x 4.  Also since his biopsy has had some erectile dysfunction.  He specifically denies any bowel problems.  His father died from prostate cancer.  He has been seen by urology and is now referred to radiation oncology for consideration of treatment.  PLANNED TREATMENT REGIMEN: Image guided IMRT radiation therapy plus ADT therapy  PAST MEDICAL HISTORY:  has a past medical history of Hypertension.    PAST SURGICAL HISTORY: History reviewed. No pertinent surgical history.  FAMILY HISTORY: family history includes Cancer in his father; Healthy in his mother.  SOCIAL HISTORY:  reports that he has never smoked. He has never used smokeless tobacco. He reports current alcohol use. He reports that he does not use drugs.  ALLERGIES: Patient has no known allergies.  MEDICATIONS:  Current Outpatient Medications  Medication Sig Dispense Refill   losartan (COZAAR) 25 MG tablet Take by mouth.     No current  facility-administered medications for this encounter.    ECOG PERFORMANCE STATUS:  0 - Asymptomatic  REVIEW OF SYSTEMS: Patient denies any weight loss, fatigue, weakness, fever, chills or night sweats. Patient denies any loss of vision, blurred vision. Patient denies any ringing  of the ears or hearing loss. No irregular heartbeat. Patient denies heart murmur or history of fainting. Patient denies any chest pain or pain radiating to her upper extremities. Patient denies any shortness of breath, difficulty breathing at night, cough or hemoptysis. Patient denies any swelling in the lower legs. Patient denies any nausea vomiting, vomiting of blood, or coffee ground material in the vomitus. Patient denies any stomach pain. Patient states has had normal bowel movements no significant constipation or diarrhea. Patient denies any dysuria, hematuria or significant nocturia. Patient denies any problems walking, swelling in the joints or loss of balance. Patient denies any skin changes, loss of hair or loss of weight. Patient denies any excessive worrying or anxiety or significant depression. Patient denies any problems with insomnia. Patient denies excessive thirst, polyuria, polydipsia. Patient denies any swollen glands, patient denies easy bruising or easy bleeding. Patient denies any recent infections, allergies or URI. Patient "s visual fields have not changed significantly in recent time.   PHYSICAL EXAM: BP 126/74   Pulse 92   Temp 98.3 F (36.8 C) (Tympanic)   Resp 16   Ht 5' 4.5" (1.638 m)   Wt 188 lb 6.4 oz (85.5 kg)   BMI 31.84 kg/m  Well-developed well-nourished patient in NAD. HEENT reveals PERLA, EOMI,  discs not visualized.  Oral cavity is clear. No oral mucosal lesions are identified. Neck is clear without evidence of cervical or supraclavicular adenopathy. Lungs are clear to A&P. Cardiac examination is essentially unremarkable with regular rate and rhythm without murmur rub or thrill. Abdomen  is benign with no organomegaly or masses noted. Motor sensory and DTR levels are equal and symmetric in the upper and lower extremities. Cranial nerves II through XII are grossly intact. Proprioception is intact. No peripheral adenopathy or edema is identified. No motor or sensory levels are noted. Crude visual fields are within normal range.  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  Patient is adamant and refusing surgery which has been offered by urology.  Have gone over recommendations for image guided IMRT radiation therapy as well as I-125 interstitial implant.  Risks and benefits of both treatments were reviewed with the patient.  He is anxious to get started and has opted for image guided IMRT treatment.  I have asked Dr. Lonna Cobb to place fiducial markers in his prostate for daily image guided treatment.  I also like him started on 6 months of Eligard concurrently with his radiation.  Risks and benefits of treatment clued increased lower urinary tract symptoms diarrhea fatigue alteration blood counts skin reaction all were reviewed in detail with the patient.  We will simulate him after his markers are placed.  Patient comprehends my recommendations well.  I would like to take this opportunity to thank you for allowing me to participate in the care of your patient.Carmina Miller, MD

## 2023-03-31 ENCOUNTER — Other Ambulatory Visit: Payer: Self-pay | Admitting: Radiation Oncology

## 2023-03-31 ENCOUNTER — Telehealth: Payer: Self-pay

## 2023-03-31 DIAGNOSIS — C61 Malignant neoplasm of prostate: Secondary | ICD-10-CM

## 2023-03-31 NOTE — Telephone Encounter (Signed)
Prior auth for Eligard initiated via phone at [phone].   PENDING AUTH# [id number]  Clinicals faxed to [phone]

## 2023-03-31 NOTE — Telephone Encounter (Signed)
-----   Message from [facility] Crystal M sent at 03/19/2023 11:10 AM EST ----- Appt made for 11/22 at 1145. Thanks.   Crystal ----- Message ----- From: Vanita Panda, RN Sent: 03/17/2023  11:24 AM EST To: Dorcas Carrow, RN; Darol Destine, CMA; #  Patient needs markers placed and Eligard injection scheduled. Thanks, [employer]

## 2023-04-02 ENCOUNTER — Ambulatory Visit: Payer: BLUE CROSS/BLUE SHIELD | Admitting: Urology

## 2023-04-03 NOTE — Telephone Encounter (Signed)
Called [employer] for status update, no answer as they are on [place] and are closed. Left detailed message and requesting they call back.

## 2023-04-04 ENCOUNTER — Encounter: Payer: Self-pay | Admitting: Urology

## 2023-04-04 ENCOUNTER — Ambulatory Visit: Payer: BLUE CROSS/BLUE SHIELD | Admitting: Urology

## 2023-04-04 VITALS — BP 147/86 | HR 90 | Ht 64.0 in | Wt 190.0 lb

## 2023-04-04 DIAGNOSIS — Z2989 Encounter for other specified prophylactic measures: Secondary | ICD-10-CM

## 2023-04-04 DIAGNOSIS — C61 Malignant neoplasm of prostate: Secondary | ICD-10-CM

## 2023-04-04 MED ORDER — GENTAMICIN SULFATE 40 MG/ML IJ SOLN
80.0000 mg | Freq: Once | INTRAMUSCULAR | Status: AC
  Administered 2023-04-04: 80 mg via INTRAMUSCULAR

## 2023-04-04 MED ORDER — LEVOFLOXACIN 500 MG PO TABS
500.0000 mg | ORAL_TABLET | Freq: Once | ORAL | Status: AC
  Administered 2023-04-04: 500 mg via ORAL

## 2023-04-04 NOTE — Progress Notes (Signed)
    04/04/23  CC: gold fiducial marker placement  HPI: 67 y.o. male with prostate cancer who presents today for placement of fiducial markers in anticipation of his upcoming IMRT with Dr. Rushie Chestnut.  Prostate Gold fiducial Marker Placement Procedure   Informed consent was obtained after discussing risks/benefits of the procedure.  A time out was performed to ensure correct patient identity.  Pre-Procedure: - Gentamicin given prophylactically - PO Levaquin 500 mg also given today  Procedure: - Lidocaine jelly was administered per rectum - Rectal ultrasound probe was placed without difficulty and the prostate visualized - Prostatic block performed with 10 mL 1% Xylocaine - 3 fiducial gold seed markers placed, one at right base, one at left base, one at apex of prostate gland under transrectal ultrasound guidance  Post-Procedure: - Patient tolerated the procedure well - He was counseled to seek immediate medical attention if experiences any severe pain, significant bleeding, or fevers    Irineo Axon, MD

## 2023-04-08 ENCOUNTER — Ambulatory Visit
Admission: RE | Admit: 2023-04-08 | Discharge: 2023-04-08 | Disposition: A | Discharge status: Home / Self Care | Payer: BLUE CROSS/BLUE SHIELD | Source: Ambulatory Visit | Attending: Radiation Oncology | Admitting: Radiation Oncology

## 2023-04-08 DIAGNOSIS — C61 Malignant neoplasm of prostate: Secondary | ICD-10-CM | POA: Diagnosis not present

## 2023-04-14 NOTE — Telephone Encounter (Signed)
Incoming denial of Eligard from [employer], appeal started.

## 2023-04-17 ENCOUNTER — Other Ambulatory Visit: Payer: Self-pay | Admitting: *Deleted

## 2023-04-17 DIAGNOSIS — C61 Malignant neoplasm of prostate: Secondary | ICD-10-CM

## 2023-04-18 ENCOUNTER — Ambulatory Visit
Admission: RE | Admit: 2023-04-18 | Discharge: 2023-04-18 | Disposition: A | Discharge status: Home / Self Care | Payer: BLUE CROSS/BLUE SHIELD | Source: Ambulatory Visit | Attending: Radiation Oncology | Admitting: Radiation Oncology

## 2023-04-18 DIAGNOSIS — C61 Malignant neoplasm of prostate: Secondary | ICD-10-CM | POA: Diagnosis present

## 2023-04-22 ENCOUNTER — Ambulatory Visit
Admission: RE | Admit: 2023-04-22 | Discharge: 2023-04-22 | Disposition: A | Discharge status: Home / Self Care | Payer: BLUE CROSS/BLUE SHIELD | Source: Ambulatory Visit | Attending: Radiation Oncology | Admitting: Radiation Oncology

## 2023-04-22 ENCOUNTER — Other Ambulatory Visit: Payer: Self-pay

## 2023-04-22 DIAGNOSIS — C61 Malignant neoplasm of prostate: Secondary | ICD-10-CM | POA: Diagnosis not present

## 2023-04-22 LAB — RAD ONC ARIA SESSION SUMMARY
Course Elapsed Days: 0
Plan Fractions Treated to Date: 1
Plan Prescribed Dose Per Fraction: 2 Gy
Plan Total Fractions Prescribed: 40
Plan Total Prescribed Dose: 80 Gy
Reference Point Dosage Given to Date: 2 Gy
Reference Point Session Dosage Given: 2 Gy
Session Number: 1

## 2023-04-22 NOTE — Telephone Encounter (Signed)
Incoming approval of appeal for Eligard.  Reference # [id number]  Dates: 03/31/23 - 09/28/23    Pt scheduled.

## 2023-04-23 ENCOUNTER — Ambulatory Visit
Admission: RE | Admit: 2023-04-23 | Discharge: 2023-04-23 | Disposition: A | Discharge status: Home / Self Care | Payer: BLUE CROSS/BLUE SHIELD | Source: Ambulatory Visit | Attending: Radiation Oncology | Admitting: Radiation Oncology

## 2023-04-23 ENCOUNTER — Other Ambulatory Visit: Payer: Self-pay

## 2023-04-23 DIAGNOSIS — C61 Malignant neoplasm of prostate: Secondary | ICD-10-CM | POA: Diagnosis not present

## 2023-04-23 LAB — RAD ONC ARIA SESSION SUMMARY
Course Elapsed Days: 1
Plan Fractions Treated to Date: 2
Plan Prescribed Dose Per Fraction: 2 Gy
Plan Total Fractions Prescribed: 40
Plan Total Prescribed Dose: 80 Gy
Reference Point Dosage Given to Date: 4 Gy
Reference Point Session Dosage Given: 2 Gy
Session Number: 2

## 2023-04-24 ENCOUNTER — Other Ambulatory Visit: Payer: Self-pay

## 2023-04-24 ENCOUNTER — Ambulatory Visit
Admission: RE | Admit: 2023-04-24 | Discharge: 2023-04-24 | Disposition: A | Discharge status: Home / Self Care | Payer: BLUE CROSS/BLUE SHIELD | Source: Ambulatory Visit | Attending: Radiation Oncology | Admitting: Radiation Oncology

## 2023-04-24 DIAGNOSIS — C61 Malignant neoplasm of prostate: Secondary | ICD-10-CM | POA: Diagnosis not present

## 2023-04-24 LAB — RAD ONC ARIA SESSION SUMMARY
Course Elapsed Days: 2
Plan Fractions Treated to Date: 3
Plan Prescribed Dose Per Fraction: 2 Gy
Plan Total Fractions Prescribed: 40
Plan Total Prescribed Dose: 80 Gy
Reference Point Dosage Given to Date: 6 Gy
Reference Point Session Dosage Given: 2 Gy
Session Number: 3

## 2023-04-25 ENCOUNTER — Ambulatory Visit
Admission: RE | Admit: 2023-04-25 | Discharge: 2023-04-25 | Disposition: A | Discharge status: Home / Self Care | Payer: BLUE CROSS/BLUE SHIELD | Source: Ambulatory Visit | Attending: Radiation Oncology | Admitting: Radiation Oncology

## 2023-04-25 ENCOUNTER — Other Ambulatory Visit: Payer: Self-pay

## 2023-04-25 DIAGNOSIS — C61 Malignant neoplasm of prostate: Secondary | ICD-10-CM | POA: Diagnosis not present

## 2023-04-25 LAB — RAD ONC ARIA SESSION SUMMARY
Course Elapsed Days: 3
Plan Fractions Treated to Date: 4
Plan Prescribed Dose Per Fraction: 2 Gy
Plan Total Fractions Prescribed: 40
Plan Total Prescribed Dose: 80 Gy
Reference Point Dosage Given to Date: 8 Gy
Reference Point Session Dosage Given: 2 Gy
Session Number: 4

## 2023-04-28 ENCOUNTER — Ambulatory Visit
Admission: RE | Admit: 2023-04-28 | Discharge: 2023-04-28 | Disposition: A | Discharge status: Home / Self Care | Payer: BLUE CROSS/BLUE SHIELD | Source: Ambulatory Visit | Attending: Radiation Oncology | Admitting: Radiation Oncology

## 2023-04-28 ENCOUNTER — Other Ambulatory Visit: Payer: Self-pay

## 2023-04-28 DIAGNOSIS — C61 Malignant neoplasm of prostate: Secondary | ICD-10-CM | POA: Diagnosis not present

## 2023-04-28 LAB — RAD ONC ARIA SESSION SUMMARY
Course Elapsed Days: 6
Plan Fractions Treated to Date: 5
Plan Prescribed Dose Per Fraction: 2 Gy
Plan Total Fractions Prescribed: 40
Plan Total Prescribed Dose: 80 Gy
Reference Point Dosage Given to Date: 10 Gy
Reference Point Session Dosage Given: 2 Gy
Session Number: 5

## 2023-04-29 ENCOUNTER — Other Ambulatory Visit: Payer: Self-pay

## 2023-04-29 ENCOUNTER — Ambulatory Visit
Admission: RE | Admit: 2023-04-29 | Discharge: 2023-04-29 | Disposition: A | Discharge status: Home / Self Care | Payer: BLUE CROSS/BLUE SHIELD | Source: Ambulatory Visit | Attending: Radiation Oncology | Admitting: Radiation Oncology

## 2023-04-29 DIAGNOSIS — C61 Malignant neoplasm of prostate: Secondary | ICD-10-CM | POA: Diagnosis not present

## 2023-04-29 LAB — RAD ONC ARIA SESSION SUMMARY
Course Elapsed Days: 7
Plan Fractions Treated to Date: 6
Plan Prescribed Dose Per Fraction: 2 Gy
Plan Total Fractions Prescribed: 40
Plan Total Prescribed Dose: 80 Gy
Reference Point Dosage Given to Date: 12 Gy
Reference Point Session Dosage Given: 2 Gy
Session Number: 6

## 2023-04-30 ENCOUNTER — Other Ambulatory Visit: Payer: Self-pay

## 2023-04-30 ENCOUNTER — Ambulatory Visit
Admission: RE | Admit: 2023-04-30 | Discharge: 2023-04-30 | Disposition: A | Discharge status: Home / Self Care | Payer: BLUE CROSS/BLUE SHIELD | Source: Ambulatory Visit | Attending: Radiation Oncology | Admitting: Radiation Oncology

## 2023-04-30 ENCOUNTER — Inpatient Hospital Stay: Payer: BLUE CROSS/BLUE SHIELD

## 2023-04-30 DIAGNOSIS — C61 Malignant neoplasm of prostate: Secondary | ICD-10-CM | POA: Insufficient documentation

## 2023-04-30 LAB — RAD ONC ARIA SESSION SUMMARY
Course Elapsed Days: 8
Plan Fractions Treated to Date: 7
Plan Prescribed Dose Per Fraction: 2 Gy
Plan Total Fractions Prescribed: 40
Plan Total Prescribed Dose: 80 Gy
Reference Point Dosage Given to Date: 14 Gy
Reference Point Session Dosage Given: 2 Gy
Session Number: 7

## 2023-04-30 LAB — CBC (CANCER CENTER ONLY)
HCT: 39.9 % (ref 39.0–52.0)
Hemoglobin: 13.1 g/dL (ref 13.0–17.0)
MCH: 30.8 pg (ref 26.0–34.0)
MCHC: 32.8 g/dL (ref 30.0–36.0)
MCV: 93.7 fL (ref 80.0–100.0)
Platelet Count: 253 10*3/uL (ref 150–400)
RBC: 4.26 MIL/uL (ref 4.22–5.81)
RDW: 13.4 % (ref 11.5–15.5)
WBC Count: 4.2 10*3/uL (ref 4.0–10.5)
nRBC: 0 % (ref 0.0–0.2)

## 2023-05-01 ENCOUNTER — Ambulatory Visit
Admission: RE | Admit: 2023-05-01 | Discharge: 2023-05-01 | Disposition: A | Discharge status: Home / Self Care | Payer: BLUE CROSS/BLUE SHIELD | Source: Ambulatory Visit | Attending: Radiation Oncology | Admitting: Radiation Oncology

## 2023-05-01 ENCOUNTER — Other Ambulatory Visit: Payer: Self-pay

## 2023-05-01 DIAGNOSIS — C61 Malignant neoplasm of prostate: Secondary | ICD-10-CM | POA: Diagnosis not present

## 2023-05-01 LAB — RAD ONC ARIA SESSION SUMMARY
Course Elapsed Days: 9
Plan Fractions Treated to Date: 8
Plan Prescribed Dose Per Fraction: 2 Gy
Plan Total Fractions Prescribed: 40
Plan Total Prescribed Dose: 80 Gy
Reference Point Dosage Given to Date: 16 Gy
Reference Point Session Dosage Given: 2 Gy
Session Number: 8

## 2023-05-01 NOTE — Progress Notes (Signed)
05/02/2023 9:11 AM   Brian Ingram 08-May-1956 [id number]  Referring provider: No referring provider defined for this encounter.  Urological history: 1.  Favorable intermediate risk prostate cancer -PSA 04/29/22 elevated 7.86; repeat PSA 10/29/22 persistently elevated at 6.63  -Prior baseline PSA in the mid to upper 3 range. -Fusion biopsy performed (01/2023) - 3 biopsies each ROI + standard 12 core template biopsies were performed.  Pathology: PI-RADS 4 lesion showed Gleason 3+3 adenocarcinoma involving 6% of the submitted tissue; PI-RADS 3 lesion with benign prostate tissue.  3/6 left template cores positive for Gleason 3+3 adenocarcinoma involving 12-22% of submitted tissue; 4/6 right template cores with Gleason 3+4 adenocarcinoma RB and RM involving 11% and 25% respectively and Gleason 3+3 adenocarcinoma RLB and are LM involving 5 and 2% of submitted tissue respectively. -Gold seeds placed (03/2023)  03/2023  -started IMRT   Chief Complaint  Patient presents with   Follow-up    Eligard injection   HPI: Brian Ingram is a 67 y.o. male who presents today to start 6 months ADT.  Previous records reviewed.   PMH: Past Medical History:  Diagnosis Date   Hypertension     Surgical History: No past surgical history on file.  Home Medications:  Allergies as of 05/02/2023   No Known Allergies      Medication List        Accurate as of May 02, 2023  9:11 AM. If you have any questions, ask your nurse or doctor.          losartan 25 MG tablet Commonly known as: COZAAR Take by mouth.        Allergies: No Known Allergies  Family History: Family History  Problem Relation Age of Onset   Healthy Mother    Cancer Father     Social History:  reports that he has never smoked. He has never been exposed to tobacco smoke. He has never used smokeless tobacco. He reports current alcohol use. He reports that he does not use drugs.  ROS: Pertinent ROS in  HPI  Physical Exam: BP 127/79   Pulse 84   Ht 5\' 4"  (1.626 m)   Wt 190 lb (86.2 kg)   BMI 32.61 kg/m   Constitutional:  Well nourished. Alert and oriented, No acute distress. HEENT: [place] AT, moist mucus membranes.  Trachea midline Cardiovascular: No clubbing, cyanosis, or edema. Respiratory: Normal respiratory effort, no increased work of breathing. Neurologic: Grossly intact, no focal deficits, moving all 4 extremities. Psychiatric: Normal mood and affect.  Laboratory Data: Lab Results  Component Value Date   WBC 4.2 04/30/2023   HGB 13.1 04/30/2023   HCT 39.9 04/30/2023   MCV 93.7 04/30/2023   PLT 253 04/30/2023   Comprehensive Metabolic Panel (CMP) Order: [id number] Component Ref Range & Units 1 yr ago  Glucose 70 - 110 mg/dL 782  Sodium 956 - 213 mmol/L 136  Potassium 3.6 - 5.1 mmol/L 4.1  Chloride 97 - 109 mmol/L 98  Carbon Dioxide (CO2) 22.0 - 32.0 mmol/L 31.6  Urea Nitrogen (BUN) 7 - 25 mg/dL 13  Creatinine 0.7 - 1.3 mg/dL 1.1  Glomerular Filtration Rate (eGFR) >60 mL/min/1.73sq m 75  Comment: CKD-EPI (2021) does not include patient's race in the calculation of eGFR.  Monitoring changes of plasma creatinine and eGFR over time is useful for monitoring kidney function.  Interpretive Ranges for eGFR (CKD-EPI 2021):  eGFR:       >60 mL/min/1.73 sq. m - Normal eGFR:  30-59 mL/min/1.73 sq. m - Moderately Decreased eGFR:       15-29 mL/min/1.73 sq. m  - Severely Decreased eGFR:       < 15 mL/min/1.73 sq. m  - Kidney Failure   Note: These eGFR calculations do not apply in acute situations when eGFR is changing rapidly or patients on dialysis.  Calcium 8.7 - 10.3 mg/dL 9.1  AST 8 - 39 U/L 43 High   ALT 6 - 57 U/L 32  Alk Phos (alkaline Phosphatase) 34 - 104 U/L 40  Albumin 3.5 - 4.8 g/dL 4.2  Bilirubin, Total 0.3 - 1.2 mg/dL 0.5  Protein, Total 6.1 - 7.9 g/dL 7.4  A/G Ratio 1.0 - 5.0 gm/dL 1.3  Resulting Agency [facility]    Specimen Collected: 04/29/22 09:51   Performed by: [facility] Last Resulted: 04/29/22 16:45  Received From: [facility]  Result Received: 08/07/22 10:27   Hemoglobin A1C Order: [id number] Component Ref Range & Units 1 yr ago  Hemoglobin A1C 4.2 - 5.6 % 5.8 High   Average Blood Glucose (Calc) mg/dL 846  Resulting Agency [facility]  Narrative Performed by [facility] Normal Range:    4.2 - 5.6% Increased Risk:  5.7 - 6.4% Diabetes:        >= 6.5% Glycemic Control for adults with diabetes:  <7%    Specimen Collected: 04/29/22 09:51   Performed by: [facility] Last Resulted: 04/29/22 13:19  Received From: [facility]  Result Received: 08/07/22 10:27   Lipid Panel w/calc LDL Order: [id number] Component Ref Range & Units 1 yr ago  Cholesterol, Total 100 - 200 mg/dL 324  Triglyceride 35 - 199 mg/dL 401  HDL (High Density Lipoprotein) Cholesterol 29.0 - 71.0 mg/dL 02.7  LDL Calculated 0 - 130 mg/dL 81  VLDL Cholesterol mg/dL 36  Cholesterol/HDL Ratio 3.6  Resulting Agency [facility]   Specimen Collected: 04/29/22 09:51   Performed by: [facility] Last Resulted: 04/29/22 16:45  Received From: [facility]  Result Received: 08/07/22 10:27   Thyroid Stimulating-Hormone (TSH) Order: [id number] Component Ref Range & Units 1 yr ago  Thyroid Stimulating Hormone (TSH) 0.450-5.330 uIU/ml uIU/mL 1.921  Resulting Agency [facility] - [facility]   Specimen Collected: 04/29/22 09:51   Performed by: [facility] Last Resulted: 04/29/22 16:13  Received From: [facility]  Result Received: 08/07/22 10:27  I have reviewed the labs.   Pertinent Imaging: N/A  Assessment & Plan:    1.  Favorable intermediate prostate cancer -Currently undergoing IMRT -Discussed with patient injection site reactions  could include transient burning and stinging, pain, bruising and redness -Discussed with patient that common side effects include hot flashes, sweats, fatigue, weakness, muscle pain, dizziness, clamminess, testicular shrinkage, decreased erections and enlargement of breasts -Discussed the side effect of thinning of the bones that may lead to a fracture and it is important that he start calcium supplementation with vitamin D -Discussed the increased risks of heart attack, irregular heartbeat, sudden death due to heart problems and stroke -Discussed that elevated blood sugar and increased risk developing diabetes may occur   Return in about 6 months (around 10/31/2023) for follow up .  These notes generated with voice recognition software. I apologize for typographical errors.  Cloretta Ned  [facility] [address]  [address] [place], [place] [address] [phone]  I spent 25 minutes on the day of the encounter to include pre-visit record review, face-to-face time with the patient, and post-visit ordering of tests.

## 2023-05-02 ENCOUNTER — Other Ambulatory Visit: Payer: Self-pay

## 2023-05-02 ENCOUNTER — Ambulatory Visit
Admission: RE | Admit: 2023-05-02 | Discharge: 2023-05-02 | Disposition: A | Discharge status: Home / Self Care | Payer: BLUE CROSS/BLUE SHIELD | Source: Ambulatory Visit | Attending: Radiation Oncology | Admitting: Radiation Oncology

## 2023-05-02 ENCOUNTER — Encounter: Payer: Self-pay | Admitting: Urology

## 2023-05-02 ENCOUNTER — Ambulatory Visit: Payer: BLUE CROSS/BLUE SHIELD | Admitting: Urology

## 2023-05-02 VITALS — BP 127/79 | HR 84 | Ht 64.0 in | Wt 190.0 lb

## 2023-05-02 DIAGNOSIS — C61 Malignant neoplasm of prostate: Secondary | ICD-10-CM

## 2023-05-02 LAB — RAD ONC ARIA SESSION SUMMARY
Course Elapsed Days: 10
Plan Fractions Treated to Date: 9
Plan Prescribed Dose Per Fraction: 2 Gy
Plan Total Fractions Prescribed: 40
Plan Total Prescribed Dose: 80 Gy
Reference Point Dosage Given to Date: 18 Gy
Reference Point Session Dosage Given: 2 Gy
Session Number: 9

## 2023-05-02 MED ORDER — LEUPROLIDE ACETATE (6 MONTH) 45 MG ~~LOC~~ KIT
45.0000 mg | PACK | Freq: Once | SUBCUTANEOUS | Status: AC
  Administered 2023-05-02: 45 mg via SUBCUTANEOUS

## 2023-05-02 NOTE — Progress Notes (Signed)
Patient ID: Brian Ingram, male   DOB: [birth year], 67 y.o.   MRN: [id number] Eligard SubQ Injection   Due to Prostate Cancer patient is present today for a Eligard Injection.  Medication: Eligard 6 month Dose: 45 mg  Location: right  Lot: [id number]  Exp: 07/2024  Patient tolerated well, no complications were noted  Performed by: Mervin Hack, CMA

## 2023-05-02 NOTE — Patient Instructions (Signed)
Take calcium supplements with food - 400 mg three times daily  Take Vitamin D 400 international units once daily with food

## 2023-05-05 ENCOUNTER — Other Ambulatory Visit: Payer: Self-pay

## 2023-05-05 ENCOUNTER — Ambulatory Visit
Admission: RE | Admit: 2023-05-05 | Discharge: 2023-05-05 | Disposition: A | Discharge status: Home / Self Care | Payer: BLUE CROSS/BLUE SHIELD | Source: Ambulatory Visit | Attending: Radiation Oncology | Admitting: Radiation Oncology

## 2023-05-05 DIAGNOSIS — C61 Malignant neoplasm of prostate: Secondary | ICD-10-CM | POA: Diagnosis not present

## 2023-05-05 LAB — RAD ONC ARIA SESSION SUMMARY
Course Elapsed Days: 13
Plan Fractions Treated to Date: 10
Plan Prescribed Dose Per Fraction: 2 Gy
Plan Total Fractions Prescribed: 40
Plan Total Prescribed Dose: 80 Gy
Reference Point Dosage Given to Date: 20 Gy
Reference Point Session Dosage Given: 2 Gy
Session Number: 10

## 2023-05-06 ENCOUNTER — Other Ambulatory Visit: Payer: Self-pay

## 2023-05-06 ENCOUNTER — Ambulatory Visit
Admission: RE | Admit: 2023-05-06 | Discharge: 2023-05-06 | Disposition: A | Discharge status: Home / Self Care | Payer: BLUE CROSS/BLUE SHIELD | Source: Ambulatory Visit | Attending: Radiation Oncology | Admitting: Radiation Oncology

## 2023-05-06 DIAGNOSIS — C61 Malignant neoplasm of prostate: Secondary | ICD-10-CM | POA: Diagnosis not present

## 2023-05-06 LAB — RAD ONC ARIA SESSION SUMMARY
Course Elapsed Days: 14
Plan Fractions Treated to Date: 11
Plan Prescribed Dose Per Fraction: 2 Gy
Plan Total Fractions Prescribed: 40
Plan Total Prescribed Dose: 80 Gy
Reference Point Dosage Given to Date: 22 Gy
Reference Point Session Dosage Given: 2 Gy
Session Number: 11

## 2023-05-08 ENCOUNTER — Ambulatory Visit
Admission: RE | Admit: 2023-05-08 | Discharge: 2023-05-08 | Disposition: A | Discharge status: Home / Self Care | Payer: BLUE CROSS/BLUE SHIELD | Source: Ambulatory Visit | Attending: Radiation Oncology | Admitting: Radiation Oncology

## 2023-05-08 ENCOUNTER — Other Ambulatory Visit: Payer: Self-pay

## 2023-05-08 DIAGNOSIS — C61 Malignant neoplasm of prostate: Secondary | ICD-10-CM | POA: Diagnosis not present

## 2023-05-08 LAB — RAD ONC ARIA SESSION SUMMARY
Course Elapsed Days: 16
Plan Fractions Treated to Date: 12
Plan Prescribed Dose Per Fraction: 2 Gy
Plan Total Fractions Prescribed: 40
Plan Total Prescribed Dose: 80 Gy
Reference Point Dosage Given to Date: 24 Gy
Reference Point Session Dosage Given: 2 Gy
Session Number: 12

## 2023-05-09 ENCOUNTER — Other Ambulatory Visit: Payer: Self-pay

## 2023-05-09 ENCOUNTER — Ambulatory Visit
Admission: RE | Admit: 2023-05-09 | Discharge: 2023-05-09 | Disposition: A | Discharge status: Home / Self Care | Payer: BLUE CROSS/BLUE SHIELD | Source: Ambulatory Visit | Attending: Radiation Oncology | Admitting: Radiation Oncology

## 2023-05-09 DIAGNOSIS — C61 Malignant neoplasm of prostate: Secondary | ICD-10-CM | POA: Diagnosis not present

## 2023-05-09 LAB — RAD ONC ARIA SESSION SUMMARY
Course Elapsed Days: 17
Plan Fractions Treated to Date: 13
Plan Prescribed Dose Per Fraction: 2 Gy
Plan Total Fractions Prescribed: 40
Plan Total Prescribed Dose: 80 Gy
Reference Point Dosage Given to Date: 26 Gy
Reference Point Session Dosage Given: 2 Gy
Session Number: 13

## 2023-05-12 ENCOUNTER — Ambulatory Visit
Admission: RE | Admit: 2023-05-12 | Discharge: 2023-05-12 | Disposition: A | Discharge status: Home / Self Care | Payer: BLUE CROSS/BLUE SHIELD | Source: Ambulatory Visit | Attending: Radiation Oncology | Admitting: Radiation Oncology

## 2023-05-12 ENCOUNTER — Other Ambulatory Visit: Payer: Self-pay

## 2023-05-12 DIAGNOSIS — C61 Malignant neoplasm of prostate: Secondary | ICD-10-CM | POA: Diagnosis not present

## 2023-05-12 LAB — RAD ONC ARIA SESSION SUMMARY
Course Elapsed Days: 20
Plan Fractions Treated to Date: 14
Plan Prescribed Dose Per Fraction: 2 Gy
Plan Total Fractions Prescribed: 40
Plan Total Prescribed Dose: 80 Gy
Reference Point Dosage Given to Date: 28 Gy
Reference Point Session Dosage Given: 2 Gy
Session Number: 14

## 2023-05-13 ENCOUNTER — Ambulatory Visit
Admission: RE | Admit: 2023-05-13 | Discharge: 2023-05-13 | Disposition: A | Discharge status: Home / Self Care | Payer: BLUE CROSS/BLUE SHIELD | Source: Ambulatory Visit | Attending: Radiation Oncology | Admitting: Radiation Oncology

## 2023-05-13 ENCOUNTER — Other Ambulatory Visit: Payer: Self-pay

## 2023-05-13 DIAGNOSIS — C61 Malignant neoplasm of prostate: Secondary | ICD-10-CM | POA: Insufficient documentation

## 2023-05-13 LAB — RAD ONC ARIA SESSION SUMMARY
Course Elapsed Days: 21
Plan Fractions Treated to Date: 15
Plan Prescribed Dose Per Fraction: 2 Gy
Plan Total Fractions Prescribed: 40
Plan Total Prescribed Dose: 80 Gy
Reference Point Dosage Given to Date: 30 Gy
Reference Point Session Dosage Given: 2 Gy
Session Number: 15

## 2023-05-15 ENCOUNTER — Other Ambulatory Visit: Payer: Self-pay

## 2023-05-15 ENCOUNTER — Ambulatory Visit
Admission: RE | Admit: 2023-05-15 | Discharge: 2023-05-15 | Disposition: A | Discharge status: Home / Self Care | Payer: BLUE CROSS/BLUE SHIELD | Source: Ambulatory Visit | Attending: Radiation Oncology | Admitting: Radiation Oncology

## 2023-05-15 ENCOUNTER — Inpatient Hospital Stay: Payer: BLUE CROSS/BLUE SHIELD

## 2023-05-15 DIAGNOSIS — Z510 Encounter for antineoplastic radiation therapy: Secondary | ICD-10-CM | POA: Diagnosis present

## 2023-05-15 DIAGNOSIS — C61 Malignant neoplasm of prostate: Secondary | ICD-10-CM | POA: Insufficient documentation

## 2023-05-15 LAB — RAD ONC ARIA SESSION SUMMARY
Course Elapsed Days: 23
Plan Fractions Treated to Date: 16
Plan Prescribed Dose Per Fraction: 2 Gy
Plan Total Fractions Prescribed: 40
Plan Total Prescribed Dose: 80 Gy
Reference Point Dosage Given to Date: 32 Gy
Reference Point Session Dosage Given: 2 Gy
Session Number: 16

## 2023-05-15 LAB — CBC (CANCER CENTER ONLY)
HCT: 40.7 % (ref 39.0–52.0)
Hemoglobin: 13.9 g/dL (ref 13.0–17.0)
MCH: 31.7 pg (ref 26.0–34.0)
MCHC: 34.2 g/dL (ref 30.0–36.0)
MCV: 92.9 fL (ref 80.0–100.0)
Platelet Count: 201 10*3/uL (ref 150–400)
RBC: 4.38 MIL/uL (ref 4.22–5.81)
RDW: 13.2 % (ref 11.5–15.5)
WBC Count: 3 10*3/uL — ABNORMAL LOW (ref 4.0–10.5)
nRBC: 0 % (ref 0.0–0.2)

## 2023-05-16 ENCOUNTER — Ambulatory Visit
Admission: RE | Admit: 2023-05-16 | Discharge: 2023-05-16 | Disposition: A | Discharge status: Home / Self Care | Payer: BLUE CROSS/BLUE SHIELD | Source: Ambulatory Visit | Attending: Radiation Oncology | Admitting: Radiation Oncology

## 2023-05-16 ENCOUNTER — Other Ambulatory Visit: Payer: Self-pay

## 2023-05-16 DIAGNOSIS — Z510 Encounter for antineoplastic radiation therapy: Secondary | ICD-10-CM | POA: Diagnosis not present

## 2023-05-16 LAB — RAD ONC ARIA SESSION SUMMARY
Course Elapsed Days: 24
Plan Fractions Treated to Date: 17
Plan Prescribed Dose Per Fraction: 2 Gy
Plan Total Fractions Prescribed: 40
Plan Total Prescribed Dose: 80 Gy
Reference Point Dosage Given to Date: 34 Gy
Reference Point Session Dosage Given: 2 Gy
Session Number: 17

## 2023-05-19 ENCOUNTER — Other Ambulatory Visit: Payer: Self-pay

## 2023-05-19 ENCOUNTER — Ambulatory Visit
Admission: RE | Admit: 2023-05-19 | Discharge: 2023-05-19 | Disposition: A | Discharge status: Home / Self Care | Payer: BLUE CROSS/BLUE SHIELD | Source: Ambulatory Visit | Attending: Radiation Oncology | Admitting: Radiation Oncology

## 2023-05-19 DIAGNOSIS — Z510 Encounter for antineoplastic radiation therapy: Secondary | ICD-10-CM | POA: Diagnosis not present

## 2023-05-19 LAB — RAD ONC ARIA SESSION SUMMARY
Course Elapsed Days: 27
Plan Fractions Treated to Date: 18
Plan Prescribed Dose Per Fraction: 2 Gy
Plan Total Fractions Prescribed: 40
Plan Total Prescribed Dose: 80 Gy
Reference Point Dosage Given to Date: 36 Gy
Reference Point Session Dosage Given: 2 Gy
Session Number: 18

## 2023-05-20 ENCOUNTER — Ambulatory Visit
Admission: RE | Admit: 2023-05-20 | Discharge: 2023-05-20 | Disposition: A | Discharge status: Home / Self Care | Payer: BLUE CROSS/BLUE SHIELD | Source: Ambulatory Visit | Attending: Radiation Oncology | Admitting: Radiation Oncology

## 2023-05-20 ENCOUNTER — Other Ambulatory Visit: Payer: Self-pay

## 2023-05-20 DIAGNOSIS — Z510 Encounter for antineoplastic radiation therapy: Secondary | ICD-10-CM | POA: Diagnosis not present

## 2023-05-20 LAB — RAD ONC ARIA SESSION SUMMARY
Course Elapsed Days: 28
Plan Fractions Treated to Date: 19
Plan Prescribed Dose Per Fraction: 2 Gy
Plan Total Fractions Prescribed: 40
Plan Total Prescribed Dose: 80 Gy
Reference Point Dosage Given to Date: 38 Gy
Reference Point Session Dosage Given: 2 Gy
Session Number: 19

## 2023-05-21 ENCOUNTER — Other Ambulatory Visit: Payer: Self-pay

## 2023-05-21 ENCOUNTER — Ambulatory Visit
Admission: RE | Admit: 2023-05-21 | Discharge: 2023-05-21 | Disposition: A | Discharge status: Home / Self Care | Payer: BLUE CROSS/BLUE SHIELD | Source: Ambulatory Visit | Attending: Radiation Oncology | Admitting: Radiation Oncology

## 2023-05-21 DIAGNOSIS — Z510 Encounter for antineoplastic radiation therapy: Secondary | ICD-10-CM | POA: Diagnosis not present

## 2023-05-21 LAB — RAD ONC ARIA SESSION SUMMARY
Course Elapsed Days: 29
Plan Fractions Treated to Date: 20
Plan Prescribed Dose Per Fraction: 2 Gy
Plan Total Fractions Prescribed: 40
Plan Total Prescribed Dose: 80 Gy
Reference Point Dosage Given to Date: 40 Gy
Reference Point Session Dosage Given: 2 Gy
Session Number: 20

## 2023-05-22 ENCOUNTER — Other Ambulatory Visit: Payer: Self-pay

## 2023-05-22 ENCOUNTER — Ambulatory Visit
Admission: RE | Admit: 2023-05-22 | Discharge: 2023-05-22 | Disposition: A | Discharge status: Home / Self Care | Payer: BLUE CROSS/BLUE SHIELD | Source: Ambulatory Visit | Attending: Radiation Oncology | Admitting: Radiation Oncology

## 2023-05-22 DIAGNOSIS — Z510 Encounter for antineoplastic radiation therapy: Secondary | ICD-10-CM | POA: Diagnosis not present

## 2023-05-22 LAB — RAD ONC ARIA SESSION SUMMARY
Course Elapsed Days: 30
Plan Fractions Treated to Date: 21
Plan Prescribed Dose Per Fraction: 2 Gy
Plan Total Fractions Prescribed: 40
Plan Total Prescribed Dose: 80 Gy
Reference Point Dosage Given to Date: 42 Gy
Reference Point Session Dosage Given: 2 Gy
Session Number: 21

## 2023-05-23 ENCOUNTER — Other Ambulatory Visit: Payer: Self-pay

## 2023-05-23 ENCOUNTER — Ambulatory Visit
Admission: RE | Admit: 2023-05-23 | Discharge: 2023-05-23 | Disposition: A | Discharge status: Home / Self Care | Payer: BLUE CROSS/BLUE SHIELD | Source: Ambulatory Visit | Attending: Radiation Oncology | Admitting: Radiation Oncology

## 2023-05-23 DIAGNOSIS — Z510 Encounter for antineoplastic radiation therapy: Secondary | ICD-10-CM | POA: Diagnosis not present

## 2023-05-23 LAB — RAD ONC ARIA SESSION SUMMARY
Course Elapsed Days: 31
Plan Fractions Treated to Date: 22
Plan Prescribed Dose Per Fraction: 2 Gy
Plan Total Fractions Prescribed: 40
Plan Total Prescribed Dose: 80 Gy
Reference Point Dosage Given to Date: 44 Gy
Reference Point Session Dosage Given: 2 Gy
Session Number: 22

## 2023-05-26 ENCOUNTER — Other Ambulatory Visit: Payer: Self-pay

## 2023-05-26 ENCOUNTER — Ambulatory Visit
Admission: RE | Admit: 2023-05-26 | Discharge: 2023-05-26 | Disposition: A | Discharge status: Home / Self Care | Payer: BLUE CROSS/BLUE SHIELD | Source: Ambulatory Visit | Attending: Radiation Oncology | Admitting: Radiation Oncology

## 2023-05-26 DIAGNOSIS — Z510 Encounter for antineoplastic radiation therapy: Secondary | ICD-10-CM | POA: Diagnosis not present

## 2023-05-26 LAB — RAD ONC ARIA SESSION SUMMARY
Course Elapsed Days: 34
Plan Fractions Treated to Date: 23
Plan Prescribed Dose Per Fraction: 2 Gy
Plan Total Fractions Prescribed: 40
Plan Total Prescribed Dose: 80 Gy
Reference Point Dosage Given to Date: 46 Gy
Reference Point Session Dosage Given: 2 Gy
Session Number: 23

## 2023-05-27 ENCOUNTER — Ambulatory Visit
Admission: RE | Admit: 2023-05-27 | Discharge: 2023-05-27 | Disposition: A | Discharge status: Home / Self Care | Payer: BLUE CROSS/BLUE SHIELD | Source: Ambulatory Visit | Attending: Radiation Oncology | Admitting: Radiation Oncology

## 2023-05-27 ENCOUNTER — Other Ambulatory Visit: Payer: Self-pay

## 2023-05-27 DIAGNOSIS — Z510 Encounter for antineoplastic radiation therapy: Secondary | ICD-10-CM | POA: Diagnosis not present

## 2023-05-27 LAB — RAD ONC ARIA SESSION SUMMARY
Course Elapsed Days: 35
Plan Fractions Treated to Date: 24
Plan Prescribed Dose Per Fraction: 2 Gy
Plan Total Fractions Prescribed: 40
Plan Total Prescribed Dose: 80 Gy
Reference Point Dosage Given to Date: 48 Gy
Reference Point Session Dosage Given: 2 Gy
Session Number: 24

## 2023-05-28 ENCOUNTER — Other Ambulatory Visit: Payer: Self-pay | Admitting: *Deleted

## 2023-05-28 ENCOUNTER — Ambulatory Visit
Admission: RE | Admit: 2023-05-28 | Discharge: 2023-05-28 | Disposition: A | Discharge status: Home / Self Care | Payer: BLUE CROSS/BLUE SHIELD | Source: Ambulatory Visit | Attending: Radiation Oncology | Admitting: Radiation Oncology

## 2023-05-28 ENCOUNTER — Other Ambulatory Visit: Payer: Self-pay

## 2023-05-28 ENCOUNTER — Inpatient Hospital Stay: Payer: BLUE CROSS/BLUE SHIELD

## 2023-05-28 DIAGNOSIS — Z510 Encounter for antineoplastic radiation therapy: Secondary | ICD-10-CM | POA: Diagnosis not present

## 2023-05-28 DIAGNOSIS — C61 Malignant neoplasm of prostate: Secondary | ICD-10-CM

## 2023-05-28 LAB — CBC (CANCER CENTER ONLY)
HCT: 40.4 % (ref 39.0–52.0)
Hemoglobin: 14.3 g/dL (ref 13.0–17.0)
MCH: 32.4 pg (ref 26.0–34.0)
MCHC: 35.4 g/dL (ref 30.0–36.0)
MCV: 91.4 fL (ref 80.0–100.0)
Platelet Count: 200 10*3/uL (ref 150–400)
RBC: 4.42 MIL/uL (ref 4.22–5.81)
RDW: 12.4 % (ref 11.5–15.5)
WBC Count: 3.1 10*3/uL — ABNORMAL LOW (ref 4.0–10.5)
nRBC: 0 % (ref 0.0–0.2)

## 2023-05-28 LAB — RAD ONC ARIA SESSION SUMMARY
Course Elapsed Days: 36
Plan Fractions Treated to Date: 25
Plan Prescribed Dose Per Fraction: 2 Gy
Plan Total Fractions Prescribed: 40
Plan Total Prescribed Dose: 80 Gy
Reference Point Dosage Given to Date: 50 Gy
Reference Point Session Dosage Given: 2 Gy
Session Number: 25

## 2023-05-28 MED ORDER — TAMSULOSIN HCL 0.4 MG PO CAPS: 0.4000 mg | ORAL_CAPSULE | Freq: Every day | ORAL | 11 refills | Status: DC

## 2023-05-29 ENCOUNTER — Ambulatory Visit
Admission: RE | Admit: 2023-05-29 | Discharge: 2023-05-29 | Disposition: A | Discharge status: Home / Self Care | Payer: BLUE CROSS/BLUE SHIELD | Source: Ambulatory Visit | Attending: Radiation Oncology | Admitting: Radiation Oncology

## 2023-05-29 ENCOUNTER — Other Ambulatory Visit: Payer: Self-pay

## 2023-05-29 DIAGNOSIS — Z510 Encounter for antineoplastic radiation therapy: Secondary | ICD-10-CM | POA: Diagnosis not present

## 2023-05-29 LAB — RAD ONC ARIA SESSION SUMMARY
Course Elapsed Days: 37
Plan Fractions Treated to Date: 26
Plan Prescribed Dose Per Fraction: 2 Gy
Plan Total Fractions Prescribed: 40
Plan Total Prescribed Dose: 80 Gy
Reference Point Dosage Given to Date: 52 Gy
Reference Point Session Dosage Given: 2 Gy
Session Number: 26

## 2023-05-30 ENCOUNTER — Other Ambulatory Visit: Payer: Self-pay

## 2023-05-30 ENCOUNTER — Ambulatory Visit
Admission: RE | Admit: 2023-05-30 | Discharge: 2023-05-30 | Disposition: A | Discharge status: Home / Self Care | Payer: BLUE CROSS/BLUE SHIELD | Source: Ambulatory Visit | Attending: Radiation Oncology | Admitting: Radiation Oncology

## 2023-05-30 DIAGNOSIS — Z510 Encounter for antineoplastic radiation therapy: Secondary | ICD-10-CM | POA: Diagnosis not present

## 2023-05-30 LAB — RAD ONC ARIA SESSION SUMMARY
Course Elapsed Days: 38
Plan Fractions Treated to Date: 27
Plan Prescribed Dose Per Fraction: 2 Gy
Plan Total Fractions Prescribed: 40
Plan Total Prescribed Dose: 80 Gy
Reference Point Dosage Given to Date: 54 Gy
Reference Point Session Dosage Given: 2 Gy
Session Number: 27

## 2023-06-02 ENCOUNTER — Ambulatory Visit
Admission: RE | Admit: 2023-06-02 | Discharge: 2023-06-02 | Disposition: A | Discharge status: Home / Self Care | Payer: BLUE CROSS/BLUE SHIELD | Source: Ambulatory Visit | Attending: Radiation Oncology | Admitting: Radiation Oncology

## 2023-06-02 ENCOUNTER — Other Ambulatory Visit: Payer: Self-pay

## 2023-06-02 DIAGNOSIS — Z510 Encounter for antineoplastic radiation therapy: Secondary | ICD-10-CM | POA: Diagnosis not present

## 2023-06-02 LAB — RAD ONC ARIA SESSION SUMMARY
Course Elapsed Days: 41
Plan Fractions Treated to Date: 28
Plan Prescribed Dose Per Fraction: 2 Gy
Plan Total Fractions Prescribed: 40
Plan Total Prescribed Dose: 80 Gy
Reference Point Dosage Given to Date: 56 Gy
Reference Point Session Dosage Given: 2 Gy
Session Number: 28

## 2023-06-03 ENCOUNTER — Ambulatory Visit
Admission: RE | Admit: 2023-06-03 | Discharge: 2023-06-03 | Disposition: A | Discharge status: Home / Self Care | Payer: BLUE CROSS/BLUE SHIELD | Source: Ambulatory Visit | Attending: Radiation Oncology | Admitting: Radiation Oncology

## 2023-06-03 ENCOUNTER — Other Ambulatory Visit: Payer: Self-pay

## 2023-06-03 DIAGNOSIS — Z510 Encounter for antineoplastic radiation therapy: Secondary | ICD-10-CM | POA: Diagnosis not present

## 2023-06-03 LAB — RAD ONC ARIA SESSION SUMMARY
Course Elapsed Days: 42
Plan Fractions Treated to Date: 29
Plan Prescribed Dose Per Fraction: 2 Gy
Plan Total Fractions Prescribed: 40
Plan Total Prescribed Dose: 80 Gy
Reference Point Dosage Given to Date: 58 Gy
Reference Point Session Dosage Given: 2 Gy
Session Number: 29

## 2023-06-04 ENCOUNTER — Ambulatory Visit
Admission: RE | Admit: 2023-06-04 | Discharge: 2023-06-04 | Disposition: A | Discharge status: Home / Self Care | Payer: BLUE CROSS/BLUE SHIELD | Source: Ambulatory Visit | Attending: Radiation Oncology | Admitting: Radiation Oncology

## 2023-06-04 ENCOUNTER — Other Ambulatory Visit: Payer: Self-pay

## 2023-06-04 DIAGNOSIS — Z510 Encounter for antineoplastic radiation therapy: Secondary | ICD-10-CM | POA: Diagnosis not present

## 2023-06-04 LAB — RAD ONC ARIA SESSION SUMMARY
Course Elapsed Days: 43
Plan Fractions Treated to Date: 30
Plan Prescribed Dose Per Fraction: 2 Gy
Plan Total Fractions Prescribed: 40
Plan Total Prescribed Dose: 80 Gy
Reference Point Dosage Given to Date: 60 Gy
Reference Point Session Dosage Given: 2 Gy
Session Number: 30

## 2023-06-05 ENCOUNTER — Other Ambulatory Visit: Payer: Self-pay

## 2023-06-05 ENCOUNTER — Ambulatory Visit
Admission: RE | Admit: 2023-06-05 | Discharge: 2023-06-05 | Disposition: A | Discharge status: Home / Self Care | Payer: BLUE CROSS/BLUE SHIELD | Source: Ambulatory Visit | Attending: Radiation Oncology | Admitting: Radiation Oncology

## 2023-06-05 DIAGNOSIS — Z510 Encounter for antineoplastic radiation therapy: Secondary | ICD-10-CM | POA: Diagnosis not present

## 2023-06-05 LAB — RAD ONC ARIA SESSION SUMMARY
Course Elapsed Days: 44
Plan Fractions Treated to Date: 31
Plan Prescribed Dose Per Fraction: 2 Gy
Plan Total Fractions Prescribed: 40
Plan Total Prescribed Dose: 80 Gy
Reference Point Dosage Given to Date: 62 Gy
Reference Point Session Dosage Given: 2 Gy
Session Number: 31

## 2023-06-06 ENCOUNTER — Ambulatory Visit
Admission: RE | Admit: 2023-06-06 | Discharge: 2023-06-06 | Disposition: A | Discharge status: Home / Self Care | Payer: BLUE CROSS/BLUE SHIELD | Source: Ambulatory Visit | Attending: Radiation Oncology | Admitting: Radiation Oncology

## 2023-06-06 ENCOUNTER — Other Ambulatory Visit: Payer: Self-pay

## 2023-06-06 DIAGNOSIS — Z510 Encounter for antineoplastic radiation therapy: Secondary | ICD-10-CM | POA: Diagnosis not present

## 2023-06-06 LAB — RAD ONC ARIA SESSION SUMMARY
Course Elapsed Days: 45
Plan Fractions Treated to Date: 32
Plan Prescribed Dose Per Fraction: 2 Gy
Plan Total Fractions Prescribed: 40
Plan Total Prescribed Dose: 80 Gy
Reference Point Dosage Given to Date: 64 Gy
Reference Point Session Dosage Given: 2 Gy
Session Number: 32

## 2023-06-09 ENCOUNTER — Ambulatory Visit
Admission: RE | Admit: 2023-06-09 | Discharge: 2023-06-09 | Disposition: A | Discharge status: Home / Self Care | Payer: BLUE CROSS/BLUE SHIELD | Source: Ambulatory Visit | Attending: Radiation Oncology | Admitting: Radiation Oncology

## 2023-06-09 ENCOUNTER — Other Ambulatory Visit: Payer: Self-pay

## 2023-06-09 DIAGNOSIS — Z510 Encounter for antineoplastic radiation therapy: Secondary | ICD-10-CM | POA: Diagnosis not present

## 2023-06-09 LAB — RAD ONC ARIA SESSION SUMMARY
Course Elapsed Days: 48
Plan Fractions Treated to Date: 33
Plan Prescribed Dose Per Fraction: 2 Gy
Plan Total Fractions Prescribed: 40
Plan Total Prescribed Dose: 80 Gy
Reference Point Dosage Given to Date: 66 Gy
Reference Point Session Dosage Given: 2 Gy
Session Number: 33

## 2023-06-10 ENCOUNTER — Other Ambulatory Visit: Payer: Self-pay

## 2023-06-10 ENCOUNTER — Ambulatory Visit
Admission: RE | Admit: 2023-06-10 | Discharge: 2023-06-10 | Disposition: A | Discharge status: Home / Self Care | Payer: BLUE CROSS/BLUE SHIELD | Source: Ambulatory Visit | Attending: Radiation Oncology | Admitting: Radiation Oncology

## 2023-06-10 DIAGNOSIS — Z510 Encounter for antineoplastic radiation therapy: Secondary | ICD-10-CM | POA: Diagnosis not present

## 2023-06-10 LAB — RAD ONC ARIA SESSION SUMMARY
Course Elapsed Days: 49
Plan Fractions Treated to Date: 34
Plan Prescribed Dose Per Fraction: 2 Gy
Plan Total Fractions Prescribed: 40
Plan Total Prescribed Dose: 80 Gy
Reference Point Dosage Given to Date: 68 Gy
Reference Point Session Dosage Given: 2 Gy
Session Number: 34

## 2023-06-11 ENCOUNTER — Ambulatory Visit
Admission: RE | Admit: 2023-06-11 | Discharge: 2023-06-11 | Disposition: A | Discharge status: Home / Self Care | Payer: BLUE CROSS/BLUE SHIELD | Source: Ambulatory Visit | Attending: Radiation Oncology | Admitting: Radiation Oncology

## 2023-06-11 ENCOUNTER — Inpatient Hospital Stay: Payer: BLUE CROSS/BLUE SHIELD

## 2023-06-11 ENCOUNTER — Other Ambulatory Visit: Payer: Self-pay

## 2023-06-11 DIAGNOSIS — C61 Malignant neoplasm of prostate: Secondary | ICD-10-CM

## 2023-06-11 DIAGNOSIS — Z510 Encounter for antineoplastic radiation therapy: Secondary | ICD-10-CM | POA: Diagnosis not present

## 2023-06-11 LAB — RAD ONC ARIA SESSION SUMMARY
Course Elapsed Days: 50
Plan Fractions Treated to Date: 35
Plan Prescribed Dose Per Fraction: 2 Gy
Plan Total Fractions Prescribed: 40
Plan Total Prescribed Dose: 80 Gy
Reference Point Dosage Given to Date: 70 Gy
Reference Point Session Dosage Given: 2 Gy
Session Number: 35

## 2023-06-11 LAB — CBC (CANCER CENTER ONLY)
HCT: 38.1 % — ABNORMAL LOW (ref 39.0–52.0)
Hemoglobin: 13.3 g/dL (ref 13.0–17.0)
MCH: 31.5 pg (ref 26.0–34.0)
MCHC: 34.9 g/dL (ref 30.0–36.0)
MCV: 90.3 fL (ref 80.0–100.0)
Platelet Count: 191 10*3/uL (ref 150–400)
RBC: 4.22 MIL/uL (ref 4.22–5.81)
RDW: 12 % (ref 11.5–15.5)
WBC Count: 2.8 10*3/uL — ABNORMAL LOW (ref 4.0–10.5)
nRBC: 0 % (ref 0.0–0.2)

## 2023-06-12 ENCOUNTER — Ambulatory Visit
Admission: RE | Admit: 2023-06-12 | Discharge: 2023-06-12 | Disposition: A | Discharge status: Home / Self Care | Payer: BLUE CROSS/BLUE SHIELD | Source: Ambulatory Visit | Attending: Radiation Oncology | Admitting: Radiation Oncology

## 2023-06-12 ENCOUNTER — Other Ambulatory Visit: Payer: Self-pay

## 2023-06-12 DIAGNOSIS — Z510 Encounter for antineoplastic radiation therapy: Secondary | ICD-10-CM | POA: Diagnosis not present

## 2023-06-12 LAB — RAD ONC ARIA SESSION SUMMARY
Course Elapsed Days: 51
Plan Fractions Treated to Date: 36
Plan Prescribed Dose Per Fraction: 2 Gy
Plan Total Fractions Prescribed: 40
Plan Total Prescribed Dose: 80 Gy
Reference Point Dosage Given to Date: 72 Gy
Reference Point Session Dosage Given: 2 Gy
Session Number: 36

## 2023-06-13 ENCOUNTER — Other Ambulatory Visit: Payer: Self-pay

## 2023-06-13 ENCOUNTER — Ambulatory Visit
Admission: RE | Admit: 2023-06-13 | Discharge: 2023-06-13 | Disposition: A | Discharge status: Home / Self Care | Payer: BLUE CROSS/BLUE SHIELD | Source: Ambulatory Visit | Attending: Radiation Oncology | Admitting: Radiation Oncology

## 2023-06-13 DIAGNOSIS — Z510 Encounter for antineoplastic radiation therapy: Secondary | ICD-10-CM | POA: Diagnosis not present

## 2023-06-13 LAB — RAD ONC ARIA SESSION SUMMARY
Course Elapsed Days: 52
Plan Fractions Treated to Date: 37
Plan Prescribed Dose Per Fraction: 2 Gy
Plan Total Fractions Prescribed: 40
Plan Total Prescribed Dose: 80 Gy
Reference Point Dosage Given to Date: 74 Gy
Reference Point Session Dosage Given: 2 Gy
Session Number: 37

## 2023-06-16 ENCOUNTER — Other Ambulatory Visit: Payer: Self-pay

## 2023-06-16 ENCOUNTER — Ambulatory Visit
Admission: RE | Admit: 2023-06-16 | Discharge: 2023-06-16 | Disposition: A | Discharge status: Home / Self Care | Payer: BLUE CROSS/BLUE SHIELD | Source: Ambulatory Visit | Attending: Radiation Oncology | Admitting: Radiation Oncology

## 2023-06-16 DIAGNOSIS — C61 Malignant neoplasm of prostate: Secondary | ICD-10-CM | POA: Diagnosis present

## 2023-06-16 DIAGNOSIS — Z510 Encounter for antineoplastic radiation therapy: Secondary | ICD-10-CM | POA: Diagnosis present

## 2023-06-16 LAB — RAD ONC ARIA SESSION SUMMARY
Course Elapsed Days: 55
Plan Fractions Treated to Date: 38
Plan Prescribed Dose Per Fraction: 2 Gy
Plan Total Fractions Prescribed: 40
Plan Total Prescribed Dose: 80 Gy
Reference Point Dosage Given to Date: 76 Gy
Reference Point Session Dosage Given: 2 Gy
Session Number: 38

## 2023-06-17 ENCOUNTER — Other Ambulatory Visit: Payer: Self-pay

## 2023-06-17 ENCOUNTER — Ambulatory Visit
Admission: RE | Admit: 2023-06-17 | Discharge: 2023-06-17 | Disposition: A | Discharge status: Home / Self Care | Payer: BLUE CROSS/BLUE SHIELD | Source: Ambulatory Visit | Attending: Radiation Oncology | Admitting: Radiation Oncology

## 2023-06-17 DIAGNOSIS — Z510 Encounter for antineoplastic radiation therapy: Secondary | ICD-10-CM | POA: Diagnosis not present

## 2023-06-17 LAB — RAD ONC ARIA SESSION SUMMARY
Course Elapsed Days: 56
Plan Fractions Treated to Date: 39
Plan Prescribed Dose Per Fraction: 2 Gy
Plan Total Fractions Prescribed: 40
Plan Total Prescribed Dose: 80 Gy
Reference Point Dosage Given to Date: 78 Gy
Reference Point Session Dosage Given: 2 Gy
Session Number: 39

## 2023-06-18 ENCOUNTER — Other Ambulatory Visit: Payer: Self-pay

## 2023-06-18 ENCOUNTER — Ambulatory Visit
Admission: RE | Admit: 2023-06-18 | Discharge: 2023-06-18 | Disposition: A | Discharge status: Home / Self Care | Payer: BLUE CROSS/BLUE SHIELD | Source: Ambulatory Visit | Attending: Radiation Oncology | Admitting: Radiation Oncology

## 2023-06-18 DIAGNOSIS — Z510 Encounter for antineoplastic radiation therapy: Secondary | ICD-10-CM | POA: Diagnosis not present

## 2023-06-18 LAB — RAD ONC ARIA SESSION SUMMARY
Course Elapsed Days: 57
Plan Fractions Treated to Date: 40
Plan Prescribed Dose Per Fraction: 2 Gy
Plan Total Fractions Prescribed: 40
Plan Total Prescribed Dose: 80 Gy
Reference Point Dosage Given to Date: 80 Gy
Reference Point Session Dosage Given: 2 Gy
Session Number: 40

## 2023-06-19 NOTE — Radiation Completion Notes (Signed)
 Patient Name: Brian, Ingram MRN: [id number] Date of Birth: October 19, 1955 Referring Physician: GLENDIA BARBA, M.D. Date of Service: 2023-06-19 Radiation Oncologist: Marcey Penton, M.D. [facility]                             RADIATION ONCOLOGY END OF TREATMENT NOTE     Diagnosis: C61 Malignant neoplasm of prostate Intent: Curative     HPI: Patient is a 68 year old male who presents with an elevated PSA over time 4 months ago was 6.6.  He had an MRI scan of his prostate showing 2 PI-RADS category 4 lesions in the right prostate gland with targeting data sent for UroNav.  He underwent 12 biopsies of his prostate 7 which were positive for mixture of Gleason 6 and Gleason 7 (3+4) adenocarcinoma.  Patient does have some nocturia x 4.  Also since his biopsy has had some erectile dysfunction.  He specifically denies any bowel problems.  His father died from prostate cancer.  He has been seen by urology and is now referred to radiation oncology for consideration of treatment.      ==========DELIVERED PLANS==========  First Treatment Date: 2023-04-22 Last Treatment Date: 2023-06-18   Plan Name: Prostate Site: Prostate Technique: IMRT Mode: Photon Dose Per Fraction: 2 Gy Prescribed Dose (Delivered / Prescribed): 80 Gy / 80 Gy Prescribed Fxs (Delivered / Prescribed): 40 / 40     ==========ON TREATMENT VISIT DATES========== 2023-04-22, 2023-04-29, 2023-05-05, 2023-05-13, 2023-05-20, 2023-05-28, 2023-06-03, 2023-06-10, 2023-06-17     ==========UPCOMING VISITS========== 07/21/2023 [facility] RAD ONCOLOGY FOLLOW UP 30 Penton Marcey, MD        ==========APPENDIX - ON TREATMENT VISIT NOTES==========   See weekly On Treatment Notes in Epic for details in the Media tab (listed as Progress notes on the On Treatment Visit Dates listed above).

## 2023-07-21 ENCOUNTER — Ambulatory Visit: Payer: BLUE CROSS/BLUE SHIELD | Admitting: Radiation Oncology

## 2023-07-24 ENCOUNTER — Encounter: Payer: Self-pay | Admitting: Radiation Oncology

## 2023-07-24 ENCOUNTER — Ambulatory Visit
Admission: RE | Admit: 2023-07-24 | Discharge: 2023-07-24 | Discharge status: Home / Self Care | Payer: BLUE CROSS/BLUE SHIELD | Source: Ambulatory Visit | Attending: Radiation Oncology | Admitting: Radiation Oncology

## 2023-07-24 ENCOUNTER — Other Ambulatory Visit: Payer: Self-pay | Admitting: *Deleted

## 2023-07-24 VITALS — BP 125/74 | HR 80 | Temp 97.6°F | Resp 16 | Wt 204.0 lb

## 2023-07-24 DIAGNOSIS — C61 Malignant neoplasm of prostate: Secondary | ICD-10-CM

## 2023-07-24 DIAGNOSIS — Z923 Personal history of irradiation: Secondary | ICD-10-CM | POA: Insufficient documentation

## 2023-07-24 DIAGNOSIS — R519 Headache, unspecified: Secondary | ICD-10-CM | POA: Insufficient documentation

## 2023-07-24 NOTE — Progress Notes (Signed)
 Radiation Oncology Follow up Note  Name: Brian Ingram   Date:   07/24/2023 MRN:  [id number] DOB: 1955/10/15    This 69 y.o. 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  REFERRING PROVIDER: Riki Altes, MD  HPI: Patient is a 68 year old male now out 1 month having completed image guided IMRT radiation therapy for Gleason 7 (3+4) adenocarcinoma presenting with a PSA in the 7 range.  From urologic standpoint he is doing well specifically denies any increased lower urinary tract symptoms diarrhea or fatigue.  He has been having some persistent headaches over the past 2 weeks not sure the etiology of that may have to do with his ADT therapy..  COMPLICATIONS OF TREATMENT: none  FOLLOW UP COMPLIANCE: keeps appointments   PHYSICAL EXAM:  BP 125/74   Pulse 80   Temp 97.6 F (36.4 C) (Tympanic)   Resp 16   Wt 204 lb (92.5 kg)   BMI 35.02 kg/m  Well-developed well-nourished patient in NAD. HEENT reveals PERLA, EOMI, discs not visualized.  Oral cavity is clear. No oral mucosal lesions are identified. Neck is clear without evidence of cervical or supraclavicular adenopathy. Lungs are clear to A&P. Cardiac examination is essentially unremarkable with regular rate and rhythm without murmur rub or thrill. Abdomen is benign with no organomegaly or masses noted. Motor sensory and DTR levels are equal and symmetric in the upper and lower extremities. Cranial nerves II through XII are grossly intact. Proprioception is intact. No peripheral adenopathy or edema is identified. No motor or sensory levels are noted. Crude visual fields are within normal range.  RADIOLOGY RESULTS: No current films for review  PLAN: Present time patient is doing well with a low side effect profile for his prostate radiation therapy.  And pleased with his  overall progress of asked to see him back in 3 months with a follow-up PSA at that time.  I have asked him to take some Advil for his headaches.  He is to contact his PMD should that persist over time.  Patient is to call with any concerns.  I would like to take this opportunity to thank you for allowing me to participate in the care of your patient.Carmina Miller, MD

## 2023-10-02 ENCOUNTER — Encounter: Payer: Self-pay | Admitting: Urology

## 2023-10-20 ENCOUNTER — Ambulatory Visit
Admission: EM | Admit: 2023-10-20 | Discharge: 2023-10-20 | Disposition: A | Discharge status: Home / Self Care | Attending: Emergency Medicine | Admitting: Emergency Medicine

## 2023-10-20 DIAGNOSIS — M6530 Trigger finger, unspecified finger: Secondary | ICD-10-CM | POA: Diagnosis not present

## 2023-10-20 MED ORDER — METHYLPREDNISOLONE 4 MG PO TBPK: ORAL_TABLET | ORAL | 0 refills | Status: AC

## 2023-10-20 NOTE — Discharge Instructions (Addendum)
 I have sent a referral to orthopedic surgery to be evaluated by one of the hand specialist regarding your trigger finger.  Start the Medrol Dosepak to help decrease inflammation in the tendon sheath and hopefully improve mobility and also pain.  You may either start the medication when you pick it up today by taking your breakfast and lunchtime doses together or you may start it tomorrow morning.  Follow the package instructions for dosing.

## 2023-10-20 NOTE — ED Triage Notes (Signed)
 Patient presents to [facility] for right hand pain x 2 weeks. Denies injury. No OTC meds for pain relief.

## 2023-10-20 NOTE — ED Provider Notes (Signed)
 [facility]    CSN: [id number] Arrival date & time: 10/20/23  1004      History   Chief Complaint Chief Complaint  Patient presents with   Hand Pain    HPI Brian Ingram is a 68 y.o. male.   HPI  68 year old male with past medical history significant for primary hypertension, impaired glucose tolerance, and prostate cancer who just finished radiation therapy presents for evaluation of pain in his right middle finger that has been present for the last 2 weeks.  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  He reports that his pain is worse at night and that occasionally his finger will get stuck in the flexed position and he will have to pull it back straight.  No numbness or tingling.  Past Medical History:  Diagnosis Date   Hypertension     Patient Active Problem List   Diagnosis Date Noted   Torticollis, acute 02/17/2023   Obesity (BMI 30-39.9) 08/03/2021   Primary hypertension 03/23/2021   IGT (impaired glucose tolerance) 07/11/2017    History reviewed. No pertinent surgical history.     Home Medications    Prior to Admission medications   Medication Sig Start Date End Date Taking? Authorizing Provider  methylPREDNISolone (MEDROL DOSEPAK) 4 MG TBPK tablet Take according to the package insert. 10/20/23  Yes Kent Pear, NP  losartan (COZAAR) 25 MG tablet Take by mouth. 03/23/21   [provider]  tamsulosin  (FLOMAX ) 0.4 MG CAPS capsule Take 1 capsule (0.4 mg total) by mouth daily after supper. 05/28/23   Glenis Langdon, MD    Family History Family History  Problem Relation Age of Onset   Healthy Mother    Cancer Father     Social History Social History   Tobacco Use   Smoking status: Never    Passive exposure: Never   Smokeless tobacco: Never  Vaping Use   Vaping status: Never Used  Substance Use Topics   Alcohol use: Yes    Comment: Occ.   Drug use: Never     Allergies   Patient has no known  allergies.   Review of Systems Review of Systems  Musculoskeletal:  Positive for myalgias. Negative for arthralgias and joint swelling.  Neurological:  Negative for weakness and numbness.     Physical Exam Triage Vital Signs ED Triage Vitals [10/20/23 1039]  Encounter Vitals Group     BP 130/76     Systolic BP Percentile      Diastolic BP Percentile      Pulse Rate 80     Resp 16     Temp 98.2 F (36.8 C)     Temp Source Oral     SpO2 98 %     Weight      Height      Head Circumference      Peak Flow      Pain Score      Pain Loc      Pain Education      Exclude from Growth Chart    No data found.  Updated Vital Signs BP 130/76 (BP Location: Left Arm)   Pulse 80   Temp 98.2 F (36.8 C) (Oral)   Resp 16   SpO2 98%   Visual Acuity Right Eye Distance:   Left Eye Distance:   Bilateral Distance:    Right Eye Near:   Left Eye Near:    Bilateral Near:  Physical Exam Vitals and nursing note reviewed.  Constitutional:      Appearance: Normal appearance. He is not ill-appearing.  Musculoskeletal:        General: Tenderness present. No swelling or signs of injury.  Skin:    General: Skin is warm and dry.     Capillary Refill: Capillary refill takes less than 2 seconds.     Findings: No bruising or erythema.  Neurological:     General: No focal deficit present.     Mental Status: He is alert and oriented to person, place, and time.      UC Treatments / Results  Labs (all labs ordered are listed, but only abnormal results are displayed) Labs Reviewed - No data to display  EKG   Radiology No results found.  Procedures Procedures (including critical care time)  Medications Ordered in UC Medications - No data to display  Initial Impression / Assessment and Plan / UC Course  I have reviewed the triage vital signs and the nursing notes.  Pertinent labs & imaging results that were available during my care of the patient were reviewed by me and  considered in my medical decision making (see chart for details).   Patient is a pleasant, nontoxic-appearing 68 year old male presenting for evaluation of pain in his right middle finger on the volar aspect near the MCP joint.  He has no tenderness with palpation of his distal, middle, or proximal phalanx.  No pain with palpation of the DIP or PIP joint.  Mild tenderness with palpation proximal to the MCP joint.  He is able to flex his hand fully though it does cause discomfort and he is able to fully extend his fingers.  He does report that occasionally at night his finger will become caught in a flexed position and he will have to forcefully extend it with his opposite hand.  I suspect that the patient is developing a trigger finger, though I am not able to reproduce it here in clinic.  I will refer him to [facility] care to be evaluated by one of the hand specialists.  In the meantime I will start him on a low-dose steroid pack that he can start tomorrow morning to help decrease inflammation in the tendon and see if his pain improves.   Final Clinical Impressions(s) / UC Diagnoses   Final diagnoses:  Trigger finger, acquired     Discharge Instructions      I have sent a referral to orthopedic surgery to be evaluated by one of the hand specialist regarding your trigger finger.  Start the Medrol Dosepak to help decrease inflammation in the tendon sheath and hopefully improve mobility and also pain.  You may either start the medication when you pick it up today by taking your breakfast and lunchtime doses together or you may start it tomorrow morning.  Follow the package instructions for dosing.   ED Prescriptions     Medication Sig Dispense Auth. Provider   methylPREDNISolone (MEDROL DOSEPAK) 4 MG TBPK tablet Take according to the package insert. 1 each Kent Pear, NP      PDMP not reviewed this encounter.   Kent Pear, NP 10/20/23 1133

## 2023-10-23 ENCOUNTER — Inpatient Hospital Stay: Attending: Radiation Oncology

## 2023-10-23 DIAGNOSIS — C61 Malignant neoplasm of prostate: Secondary | ICD-10-CM | POA: Insufficient documentation

## 2023-10-23 LAB — CBC (CANCER CENTER ONLY)
HCT: 34.4 % — ABNORMAL LOW (ref 39.0–52.0)
Hemoglobin: 12 g/dL — ABNORMAL LOW (ref 13.0–17.0)
MCH: 31.7 pg (ref 26.0–34.0)
MCHC: 34.9 g/dL (ref 30.0–36.0)
MCV: 91 fL (ref 80.0–100.0)
Platelet Count: 235 10*3/uL (ref 150–400)
RBC: 3.78 MIL/uL — ABNORMAL LOW (ref 4.22–5.81)
RDW: 11.8 % (ref 11.5–15.5)
WBC Count: 8.3 10*3/uL (ref 4.0–10.5)
nRBC: 0 % (ref 0.0–0.2)

## 2023-10-23 LAB — PSA: Prostatic Specific Antigen: 0.12 ng/mL (ref 0.00–4.00)

## 2023-10-30 ENCOUNTER — Ambulatory Visit
Admission: RE | Admit: 2023-10-30 | Discharge: 2023-10-30 | Disposition: A | Discharge status: Home / Self Care | Source: Ambulatory Visit | Attending: Radiation Oncology | Admitting: Radiation Oncology

## 2023-10-30 ENCOUNTER — Encounter: Payer: Self-pay | Admitting: Radiation Oncology

## 2023-10-30 VITALS — BP 108/77 | HR 92 | Temp 98.5°F | Resp 14 | Ht 64.5 in | Wt 202.0 lb

## 2023-10-30 DIAGNOSIS — Z923 Personal history of irradiation: Secondary | ICD-10-CM | POA: Diagnosis not present

## 2023-10-30 DIAGNOSIS — C61 Malignant neoplasm of prostate: Secondary | ICD-10-CM | POA: Diagnosis present

## 2023-10-30 NOTE — Progress Notes (Signed)
 Radiation Oncology Follow up Note  Name: Brian Ingram   Date:   10/30/2023 MRN:  [id number] DOB: [birth year]    This 68 y.o. 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  REFERRING PROVIDER: No ref. provider found  HPI: Patient is a 68 year old male now out 4 months having completed image guided IMRT radiation therapy for Gleason 7 adenocarcinoma the prostate seen today in routine follow-up he is doing well very low side effect profile specifically denies any increased lower urinary tract symptoms diarrhea or fatigue.  His first PSA status post treatment is 0.12.  COMPLICATIONS OF TREATMENT: none  FOLLOW UP COMPLIANCE: keeps appointments   PHYSICAL EXAM:  BP 108/77   Pulse 92   Temp 98.5 F (36.9 C) (Tympanic)   Resp 14   Ht 5' 4.5 (1.638 m)   Wt 202 lb (91.6 kg)   BMI 34.14 kg/m  Well-developed well-nourished patient in NAD. HEENT reveals PERLA, EOMI, discs not visualized.  Oral cavity is clear. No oral mucosal lesions are identified. Neck is clear without evidence of cervical or supraclavicular adenopathy. Lungs are clear to A&P. Cardiac examination is essentially unremarkable with regular rate and rhythm without murmur rub or thrill. Abdomen is benign with no organomegaly or masses noted. Motor sensory and DTR levels are equal and symmetric in the upper and lower extremities. Cranial nerves II through XII are grossly intact. Proprioception is intact. No peripheral adenopathy or edema is identified. No motor or sensory levels are noted. Crude visual fields are within normal range.  RADIOLOGY RESULTS: No current films for review  PLAN: At the present time patient is under excellent biochemical control of his prostate cancer.  Am pleased with his overall progress.  Very low side effect profile.  Of asked to see him back  in 6 months with repeat PSA.  Patient knows to call with any concerns.  I would like to take this opportunity to thank you for allowing me to participate in the care of your patient.Glenis Langdon, MD

## 2023-10-31 ENCOUNTER — Ambulatory Visit: Payer: Self-pay | Admitting: Urology

## 2023-11-04 ENCOUNTER — Ambulatory Visit (INDEPENDENT_AMBULATORY_CARE_PROVIDER_SITE_OTHER): Admitting: Urology

## 2023-11-04 VITALS — BP 122/78 | HR 92 | Ht 65.0 in | Wt 205.0 lb

## 2023-11-04 DIAGNOSIS — N529 Male erectile dysfunction, unspecified: Secondary | ICD-10-CM

## 2023-11-04 DIAGNOSIS — N5319 Other ejaculatory dysfunction: Secondary | ICD-10-CM | POA: Diagnosis not present

## 2023-11-04 DIAGNOSIS — C61 Malignant neoplasm of prostate: Secondary | ICD-10-CM | POA: Diagnosis not present

## 2023-11-04 MED ORDER — CYPROHEPTADINE HCL 4 MG PO TABS: ORAL_TABLET | ORAL | 3 refills | Status: AC

## 2023-11-08 ENCOUNTER — Encounter: Payer: Self-pay | Admitting: Urology

## 2023-11-08 NOTE — Progress Notes (Signed)
 11/04/2023 3:11 PM   Brian Ingram 05/27/1955 [id number]  Referring provider: No referring provider defined for this encounter.  Urological history: 1.  Favorable intermediate risk prostate cancer - PSA (10/2023) 0.12 - PSA 04/29/22 elevated 7.86; repeat PSA 10/29/22 persistently elevated at 6.63  - Prior baseline PSA in the mid to upper 3 range. - Fusion biopsy performed (01/2023) - 3 biopsies each ROI + standard 12 core template biopsies were performed.  Pathology: PI-RADS 4 lesion showed Gleason 3+3 adenocarcinoma involving 6% of the submitted tissue; PI-RADS 3 lesion with benign prostate tissue.  3/6 left template cores positive for Gleason 3+3 adenocarcinoma involving 12-22% of submitted tissue; 4/6 right template cores with Gleason 3+4 adenocarcinoma RB and RM involving 11% and 25% respectively and Gleason 3+3 adenocarcinoma RLB and are LM involving 5 and 2% of submitted tissue respectively. - Gold seeds placed (03/2023)  03/2023  - 6 months of ADT (04/2023)  - started IMRT   Chief Complaint  Patient presents with   Prostate Cancer   HPI: Brian Ingram is a 68 y.o. male who presents today to for six month follow up.    Previous records reviewed.   IPSS score: 12/4   Major complaint(s): Frequency and nocturia x 3, since being diagnosed with prostate cancer.  Denies any dysuria, hematuria or suprapubic pain.   Currently taking: tamsulosin .  He has had prostate cancer treated with IMRT and ADT.   Denies any recent fevers, chills, nausea or vomiting.  He has a family history of PCa with father with fatal prostate cancer.        PSA (10/2023) 0.12    IPSS     Row Name 11/04/23 1500         International Prostate Symptom Score   How often have you had the sensation of not emptying your bladder? Less than 1 in 5     How often have you had to urinate less than every two hours? About half the time     How often have you found you stopped and started again  several times when you urinated? Less than 1 in 5 times     How often have you found it difficult to postpone urination? Not at All     How often have you had a weak urinary stream? Less than half the time     How often have you had to strain to start urination? Less than half the time     How many times did you typically get up at night to urinate? 3 Times     Total IPSS Score 12       Quality of Life due to urinary symptoms   If you were to spend the rest of your life with your urinary condition just the way it is now how would you feel about that? Unhappy        Score:  1-7 Mild 8-19 Moderate 20-35 Severe   SHIM score: 5     Main complaint: He is not having ejaculation/orgasm, since his radiation for his prostate cancer.   Risk factors:  age, pelvic radiation, prostate cancer, HTN and HLD No  painful erections or curvatures with his erections.    No longer having spontaneous erections.  Tried:    N/A   SHIM     Row Name 11/04/23 1525         SHIM: Over the last 6 months:   How do you rate your confidence that you could  get and keep an erection? Very Low     When you had erections with sexual stimulation, how often were your erections hard enough for penetration (entering your partner)? A Few Times (much less than half the time)     During sexual intercourse, how often were you able to maintain your erection after you had penetrated (entered) your partner? No Sexual Activity     During sexual intercourse, how difficult was it to maintain your erection to completion of intercourse? Very Difficult     When you attempted sexual intercourse, how often was it satisfactory for you? Did not attempt intercourse       SHIM Total Score   SHIM 5        Score: 1-7 Severe ED 8-11 Moderate ED 12-16 Mild-Moderate ED 17-21 Mild ED 22-25 No ED   PMH: Past Medical History:  Diagnosis Date   Hypertension     Surgical History: No past surgical history on file.  Home  Medications:  Allergies as of 11/04/2023   No Known Allergies      Medication List        Accurate as of November 04, 2023 11:59 PM. If you have any questions, ask your nurse or doctor.          amLODipine 5 MG tablet Commonly known as: NORVASC Take 5 mg by mouth.   cyproheptadine  4 MG tablet Commonly known as: PERIACTIN  Take one tablet 3 hours prior to intercourse   losartan 25 MG tablet Commonly known as: COZAAR Take by mouth.   losartan 100 MG tablet Commonly known as: COZAAR Take 100 mg by mouth.   methylPREDNISolone  4 MG Tbpk tablet Commonly known as: MEDROL  DOSEPAK Take according to the package insert.   tamsulosin  0.4 MG Caps capsule Commonly known as: FLOMAX  Take 1 capsule (0.4 mg total) by mouth daily after supper.        Allergies: No Known Allergies  Family History: Family History  Problem Relation Age of Onset   Healthy Mother    Cancer Father     Social History:  reports that he has never smoked. He has never been exposed to tobacco smoke. He has never used smokeless tobacco. He reports current alcohol use. He reports that he does not use drugs.  ROS: Pertinent ROS in HPI  Physical Exam: BP 122/78   Pulse 92   Ht 5' 5 (1.651 m)   Wt 205 lb (93 kg)   BMI 34.11 kg/m   Constitutional:  Well nourished. Alert and oriented, No acute distress. HEENT: [place] AT, moist mucus membranes.  Trachea midline, no masses. Cardiovascular: No clubbing, cyanosis, or edema. Respiratory: Normal respiratory effort, no increased work of breathing. Neurologic: Grossly intact, no focal deficits, moving all 4 extremities. Psychiatric: Normal mood and affect.   Laboratory Data: See HPI and EPC I have reviewed the labs.  Pertinent Imaging: N/A  Assessment & Plan:    1.  Favorable intermediate prostate cancer - current PSA shows excellent biochemical control   - completed IMRT 4 months ago - completed 6 months ADT (04/2023) - still experiencing hot flashes,  but not bothersome - followed by Dr. Camelia q 6 months - his urinary issues are not as concerning as his ejaculatory issues, but he will be discontinuing the tamsulosin  and will be updating me about his symptoms after he has been off the tamsulosin   - I will see him yearly to manage his urinary and ED issues   2. ED - did  not want to try PDE5i's or other treatment options   3. Ejaculatory disorder - explained that one of the side effects of tamsulosin  is ejaculatory disorder, so recommended that he stop that medication  - explained that radiating the prostate will also effect ejaculation - explained that an increase in size of the prostate will effect ejaculation and his was 61 cc on MRI - explained that age effects ejaculation  - discussed that some men see an improvement in ejaculation with Periactin  taken 3 hours prior to intercourse  - he will stop the tamsulosin  and may or may not try the Periactin , but he would like to have a prescription on hand in case he is still is having issues with ejaculation after he has been off the tamsulosin  for at least two weeks  - he will let me know how things are progressing via MyChart   Return in about 1 year (around 11/03/2024) for PSA, I PSS, SHIM  .  These notes generated with voice recognition software. I apologize for typographical errors.  CLOTILDA HELON RIGGERS  [facility] [address]  [address] [place], [place] [address] [phone]

## 2024-02-26 ENCOUNTER — Encounter: Payer: Self-pay | Admitting: Urology

## 2024-05-05 ENCOUNTER — Other Ambulatory Visit: Payer: Self-pay | Admitting: *Deleted

## 2024-05-05 DIAGNOSIS — C61 Malignant neoplasm of prostate: Secondary | ICD-10-CM

## 2024-05-11 ENCOUNTER — Inpatient Hospital Stay: Attending: Radiation Oncology

## 2024-05-11 DIAGNOSIS — C61 Malignant neoplasm of prostate: Secondary | ICD-10-CM | POA: Insufficient documentation

## 2024-05-11 LAB — PSA: Prostatic Specific Antigen: 0.53 ng/mL (ref 0.00–4.00)

## 2024-05-18 ENCOUNTER — Ambulatory Visit: Admitting: Radiation Oncology

## 2024-05-21 ENCOUNTER — Other Ambulatory Visit: Payer: Self-pay | Admitting: Radiation Oncology

## 2024-11-03 ENCOUNTER — Ambulatory Visit: Admitting: Urology

## 2024-11-09 ENCOUNTER — Ambulatory Visit: Admitting: Urology
